# Patient Record
Sex: Female | Born: 1935 | Race: White | Hispanic: No | Smoking: Never smoker
Health system: Southern US, Community
[De-identification: ages and names within clinical notes are randomized; demographics above are authoritative.]

## PROBLEM LIST (undated history)

## (undated) DIAGNOSIS — M1990 Unspecified osteoarthritis, unspecified site: Secondary | ICD-10-CM

## (undated) DIAGNOSIS — Z8719 Personal history of other diseases of the digestive system: Secondary | ICD-10-CM

## (undated) DIAGNOSIS — K635 Polyp of colon: Secondary | ICD-10-CM

## (undated) DIAGNOSIS — T884XXA Failed or difficult intubation, initial encounter: Secondary | ICD-10-CM

## (undated) DIAGNOSIS — C4491 Basal cell carcinoma of skin, unspecified: Secondary | ICD-10-CM

## (undated) DIAGNOSIS — L97529 Non-pressure chronic ulcer of other part of left foot with unspecified severity: Secondary | ICD-10-CM

## (undated) DIAGNOSIS — K219 Gastro-esophageal reflux disease without esophagitis: Secondary | ICD-10-CM

## (undated) DIAGNOSIS — I10 Essential (primary) hypertension: Secondary | ICD-10-CM

## (undated) DIAGNOSIS — L97519 Non-pressure chronic ulcer of other part of right foot with unspecified severity: Secondary | ICD-10-CM

## (undated) HISTORY — PX: OTHER SURGICAL HISTORY: SHX169

## (undated) HISTORY — DX: Essential (primary) hypertension: I10

## (undated) HISTORY — PX: TONSILLECTOMY: SUR1361

## (undated) HISTORY — PX: DILATION AND CURETTAGE OF UTERUS: SHX78

## (undated) HISTORY — DX: Basal cell carcinoma of skin, unspecified: C44.91

## (undated) SURGERY — EGD (ESOPHAGOGASTRODUODENOSCOPY)
Anesthesia: Moderate Sedation

---

## 1997-07-20 ENCOUNTER — Other Ambulatory Visit: Admission: RE | Admit: 1997-07-20 | Discharge: 1997-07-20 | Payer: Self-pay | Admitting: Obstetrics and Gynecology

## 1998-09-03 ENCOUNTER — Other Ambulatory Visit: Admission: RE | Admit: 1998-09-03 | Discharge: 1998-09-03 | Payer: Self-pay | Admitting: Obstetrics and Gynecology

## 1999-09-19 ENCOUNTER — Other Ambulatory Visit: Admission: RE | Admit: 1999-09-19 | Discharge: 1999-09-19 | Payer: Self-pay | Admitting: Obstetrics and Gynecology

## 2000-10-03 ENCOUNTER — Other Ambulatory Visit: Admission: RE | Admit: 2000-10-03 | Discharge: 2000-10-03 | Payer: Self-pay | Admitting: Obstetrics and Gynecology

## 2001-10-24 ENCOUNTER — Other Ambulatory Visit: Admission: RE | Admit: 2001-10-24 | Discharge: 2001-10-24 | Payer: Self-pay | Admitting: Obstetrics and Gynecology

## 2002-10-27 ENCOUNTER — Other Ambulatory Visit: Admission: RE | Admit: 2002-10-27 | Discharge: 2002-10-27 | Payer: Self-pay | Admitting: Obstetrics and Gynecology

## 2003-11-04 ENCOUNTER — Other Ambulatory Visit: Admission: RE | Admit: 2003-11-04 | Discharge: 2003-11-04 | Payer: Self-pay | Admitting: Obstetrics and Gynecology

## 2004-08-09 ENCOUNTER — Encounter (INDEPENDENT_AMBULATORY_CARE_PROVIDER_SITE_OTHER): Payer: Self-pay | Admitting: *Deleted

## 2004-08-09 ENCOUNTER — Ambulatory Visit (HOSPITAL_COMMUNITY): Admission: RE | Admit: 2004-08-09 | Discharge: 2004-08-09 | Payer: Self-pay | Admitting: Gastroenterology

## 2005-11-06 ENCOUNTER — Other Ambulatory Visit: Admission: RE | Admit: 2005-11-06 | Discharge: 2005-11-06 | Payer: Self-pay | Admitting: Obstetrics and Gynecology

## 2008-01-06 ENCOUNTER — Encounter: Payer: Self-pay | Admitting: Obstetrics and Gynecology

## 2008-01-06 ENCOUNTER — Other Ambulatory Visit: Admission: RE | Admit: 2008-01-06 | Discharge: 2008-01-06 | Payer: Self-pay | Admitting: Obstetrics and Gynecology

## 2008-01-06 ENCOUNTER — Ambulatory Visit: Payer: Self-pay | Admitting: Obstetrics and Gynecology

## 2008-01-15 ENCOUNTER — Encounter: Admission: RE | Admit: 2008-01-15 | Discharge: 2008-01-15 | Payer: Self-pay | Admitting: Gastroenterology

## 2008-02-05 ENCOUNTER — Ambulatory Visit (HOSPITAL_COMMUNITY): Admission: RE | Admit: 2008-02-05 | Discharge: 2008-02-05 | Payer: Self-pay | Admitting: Gastroenterology

## 2010-06-14 NOTE — Op Note (Signed)
[name], MURTHA NO.:  [id number]   MEDICAL RECORD NO.:  [id number]          PATIENT TYPE:  AMB   LOCATION:  ENDO                         FACILITY:  [facility]   PHYSICIAN:  Danise Edge, M.D.   DATE OF BIRTH:  Mar 09, 1935   DATE OF PROCEDURE:  02/05/2008  DATE OF DISCHARGE:                               OPERATIVE REPORT   REFERRING PHYSICIAN:  Thora Lance, MD   PROCEDURES:  Esophagogastroduodenoscopy and Savary esophageal dilation.   PROCEDURE INDICATIONS:  Cassandra Myers is a 75 year old female born on  19-May-1935.  Cassandra Myers has chronic gastroesophageal reflux  complicated by a Schatzki ring at the esophagogastric junction.   In 1975, her esophagogastroduodenoscopy was normal.  In 1987, she  underwent an esophagogastroduodenoscopy with dilation of a Schatzki  ring.  When she re-developed esophageal dysphagia, she underwent a  barium esophagram with tablet on January 15, 2008, which revealed a  Schatzki ring, which reduced the caliber of the distal thoracic  esophagus by two-thirds.  A moderate hiatal hernia was also noted.  The  patient's Carafate was discontinued and I gave her samples of Aciphex to  better control her gastroesophageal reflux.  She is scheduled to undergo  an esophagogastroduodenoscopy and Savary esophageal dilation of her  symptomatic Schatzki ring today.   MEDICATIONS ALLERGIES:  MERTHIOLATE causes rash.   CHRONIC MEDICATIONS:  Sucralfate, lisinopril, multivitamin.   PAST MEDICAL AND SURGICAL HISTORY:  1. Hyperlipidemia.  2. Peptic ulcer disease in 1959 and 1975.  3. Upper gastrointestinal bleed in the 1970s requiring blood      transfusion.  4. Macular degeneration.  5. Chronic gastroesophageal reflux.  6. Tonsillectomy.  7. D and C for vaginal bleeding in 1986.  8. Jaw surgery in 1990.   HEALTH MAINTENANCE:  Pneumovax in May 2005.  Screening colonoscopy with  removal of an adenomatous colon polyp in July 2006.   FAMILY HISTORY:  Father died in 1 of lung cancer.  He also had  prostate cancer and chronic obstructive pulmonary disease.  Her 90-year-  old mother has a pacemaker and esophagus small stroke.  Her brother is  healthy.   HABITS:  The patient does not smoke cigarettes and rarely consumes  alcohol.   ENDOSCOPIST:  Danise Edge, MD   PREMEDICATIONS:  1. Fentanyl 75 mcg.  2. Versed 10 mg.   PROCEDURE:  After obtaining informed consent, Cassandra Myers was placed in the  left lateral decubitus position on the fluoroscopy table.  I  administered intravenous fentanyl and intravenous Versed to achieve  conscious sedation for the procedure.  She received 75 mcg fentanyl and  10 mg Versed.  The patient's blood pressure, oxygen saturation, and  cardiac rhythm were monitored throughout the procedure and documented in  the medical record.   The Pentax gastroscope was passed through the posterior hypopharynx into  the proximal esophagus without difficulty.  The hypopharynx, larynx, and  vocal cords appeared normal.   ESOPHAGOSCOPY:  The proximal mid and lower segments of the esophageal  mucosa appeared normal except for the presence of  a Schatzki ring at the  esophagogastric junction, which was noted at approximately 35 cm from  the incisor teeth.  There is no endoscopic evidence for the presence of  erosive esophagitis or Barrett esophagus.   GASTROSCOPY:  Cassandra Myers has a moderate-sized hiatal hernia.  Retroflex  view of the gastric cardia and fundus was otherwise normal.  The gastric  body, antrum, and pylorus appeared normal.   DUODENOSCOPY:  The duodenal bulb and descending duodenum appeared  normal.   SAVARY ESOPHAGEAL DILATION:  The Savary dilator wire was passed through  the Pentax gastroscope and the tip of the guidewire advanced to the  distal gastric antrum as confirmed endoscopically and fluoroscopically.  Under fluoroscopic guidance, the 12.8 mm, 14 mm, and 15-mm Savary   dilators passed without significant resistance.  Repeat  esophagogastroscopy confirmed satisfactory dilation of the Schatzki ring  at the esophagogastric junction and no gastric trauma due to the  guidewire.  There was a residual stricture despite passing the 15-mm  Savary dilator and she may require repeat esophageal dilation in the  future.   ASSESSMENT:  Chronic gastroesophageal reflux disease associated with a  moderate-sized hiatal hernia and complicated by a Schatzki ring at the  esophagogastric junction (35 cm from the incisor teeth), dilated with  the 12.8 mm, 14 mm, and 15-mm Savary dilators.  No endoscopic evidence  for the presence of erosive esophagitis or Barrett esophagus.   RECOMMENDATIONS:  Ms. Cassandra Myers should be switched from Carafate to a proton  pump inhibitor to better control her chronic gastroesophageal reflux.           ______________________________  Danise Edge, M.D.     MJ/MEDQ  D:  02/05/2008  T:  02/05/2008  Job:  [id number]   cc:   Thora Lance, M.D.

## 2010-06-17 NOTE — Op Note (Signed)
NAMEJOYE, WESENBERG NO.:  [id number]   MEDICAL RECORD NO.:  [id number]          PATIENT TYPE:  AMB   LOCATION:  ENDO                         FACILITY[facility]   PHYSICIAN:  Danise Edge, M.D.   DATE OF BIRTH:  October 14, 1935   DATE OF PROCEDURE:  08/09/2004  DATE OF DISCHARGE:                                 OPERATIVE REPORT   PROCEDURE:  Colonoscopy and polypectomy.   REFERRING PHYSICIAN:  Dr. Kirby Funk.   INDICATIONS:  Ms. Cassandra Myers is a 75 year old female born Jul 15, 1935.  Ms. Cassandra Myers is scheduled to undergo her first screening colonoscopy with  polypectomy to prevent colon cancer.   ENDOSCOPIST:  Danise Edge, M.D.   PREMEDICATION:  Versed 9 mg, Demerol 80 mg.   DESCRIPTION OF PROCEDURE:  After obtaining informed consent, Ms. Cassandra Myers was  placed in the left lateral decubitus position. I administered intravenous  Demerol and intravenous Versed to achieve conscious sedation for the  procedure. The patient's blood pressure, oxygen saturation and cardiac  rhythm were monitored throughout the procedure and documented in the medical  record.   Anal inspection and digital rectal exam were normal. The Olympus adjustable  pediatric colonoscope was introduced into the rectum and easily advanced to  the cecum. A normal appearing ileocecal valve and appendiceal orifice were  identified. Colonic preparation for the exam today was excellent.   RECTUM:  Normal. Retroflexed view of the distal rectum normal.  SIGMOID COLON AND DESCENDING COLON:  Normal.  SPLENIC FLEXURE:  Normal.  TRANSVERSE COLON:  Normal.  HEPATIC FLEXURE:  Normal.  ASCENDING COLON:  From the proximal ascending colon, a 2 mm sessile polyp  was removed with the electrocautery snare.  CECUM AND ILEOCECAL VALVE:  Normal.   ASSESSMENT:  A small polyp was removed from the proximal ascending colon  with the electrocautery snare; otherwise normal screening proctocolonoscopy  to the  cecum.       MJ/MEDQ  D:  08/09/2004  T:  08/09/2004  Job:  [id number]   cc:   Thora Lance, M.D.  [address]  [place]  [place] [address]  Fax: [phone]

## 2011-01-25 ENCOUNTER — Other Ambulatory Visit: Payer: Self-pay | Admitting: Dermatology

## 2011-03-27 DIAGNOSIS — C44319 Basal cell carcinoma of skin of other parts of face: Secondary | ICD-10-CM | POA: Diagnosis not present

## 2011-07-27 DIAGNOSIS — H04129 Dry eye syndrome of unspecified lacrimal gland: Secondary | ICD-10-CM | POA: Diagnosis not present

## 2011-07-27 DIAGNOSIS — H25099 Other age-related incipient cataract, unspecified eye: Secondary | ICD-10-CM | POA: Diagnosis not present

## 2011-09-25 DIAGNOSIS — Z0000 Encounter for general adult medical examination without abnormal findings: Secondary | ICD-10-CM | POA: Diagnosis not present

## 2011-09-25 DIAGNOSIS — Z1331 Encounter for screening for depression: Secondary | ICD-10-CM | POA: Diagnosis not present

## 2011-09-25 DIAGNOSIS — K219 Gastro-esophageal reflux disease without esophagitis: Secondary | ICD-10-CM | POA: Diagnosis not present

## 2011-09-25 DIAGNOSIS — I10 Essential (primary) hypertension: Secondary | ICD-10-CM | POA: Diagnosis not present

## 2011-10-12 ENCOUNTER — Other Ambulatory Visit: Payer: Self-pay | Admitting: Surgery

## 2011-10-12 DIAGNOSIS — L98499 Non-pressure chronic ulcer of skin of other sites with unspecified severity: Secondary | ICD-10-CM | POA: Diagnosis not present

## 2011-10-12 DIAGNOSIS — D485 Neoplasm of uncertain behavior of skin: Secondary | ICD-10-CM | POA: Diagnosis not present

## 2011-12-12 DIAGNOSIS — Z1231 Encounter for screening mammogram for malignant neoplasm of breast: Secondary | ICD-10-CM | POA: Diagnosis not present

## 2011-12-13 ENCOUNTER — Encounter: Payer: Self-pay | Admitting: Obstetrics and Gynecology

## 2011-12-21 DIAGNOSIS — L905 Scar conditions and fibrosis of skin: Secondary | ICD-10-CM | POA: Diagnosis not present

## 2012-01-02 DIAGNOSIS — Z23 Encounter for immunization: Secondary | ICD-10-CM | POA: Diagnosis not present

## 2012-01-11 ENCOUNTER — Encounter: Payer: Self-pay | Admitting: Gynecology

## 2012-01-11 DIAGNOSIS — I10 Essential (primary) hypertension: Secondary | ICD-10-CM | POA: Insufficient documentation

## 2012-01-23 ENCOUNTER — Other Ambulatory Visit (HOSPITAL_COMMUNITY)
Admission: RE | Admit: 2012-01-23 | Discharge: 2012-01-23 | Disposition: A | Discharge status: Home / Self Care | Payer: Medicare Other | Source: Ambulatory Visit | Attending: Obstetrics and Gynecology | Admitting: Obstetrics and Gynecology

## 2012-01-23 ENCOUNTER — Encounter: Payer: Self-pay | Admitting: Obstetrics and Gynecology

## 2012-01-23 ENCOUNTER — Ambulatory Visit (INDEPENDENT_AMBULATORY_CARE_PROVIDER_SITE_OTHER): Payer: Medicare Other | Admitting: Obstetrics and Gynecology

## 2012-01-23 VITALS — BP 120/64 | Ht 64.0 in | Wt 146.0 lb

## 2012-01-23 DIAGNOSIS — R351 Nocturia: Secondary | ICD-10-CM

## 2012-01-23 DIAGNOSIS — R3915 Urgency of urination: Secondary | ICD-10-CM | POA: Diagnosis not present

## 2012-01-23 DIAGNOSIS — N952 Postmenopausal atrophic vaginitis: Secondary | ICD-10-CM | POA: Diagnosis not present

## 2012-01-23 DIAGNOSIS — Z124 Encounter for screening for malignant neoplasm of cervix: Secondary | ICD-10-CM | POA: Diagnosis not present

## 2012-01-23 DIAGNOSIS — C4491 Basal cell carcinoma of skin, unspecified: Secondary | ICD-10-CM | POA: Insufficient documentation

## 2012-01-23 DIAGNOSIS — Z780 Asymptomatic menopausal state: Secondary | ICD-10-CM | POA: Diagnosis not present

## 2012-01-23 NOTE — Addendum Note (Signed)
Addended by: Dayna Barker on: 01/23/2012 11:19 AM   Modules accepted: Orders

## 2012-01-23 NOTE — Progress Notes (Signed)
Patient is a 76 year old gravida 0 who came to see [place] today for gynecological care. She has been on long-term patient of our office but has not been here in 4 years and of visit was treated as a new patient visit. She is currently having no hot flashes or vaginal dryness. Because of her husband's health she is not sexually active. She is having no vaginal bleeding. She is having no pelvic pain. She was on hormone replacement therapy for menopausal symptoms but stopped approximately 2003. She did have some menopausal symptoms having stopped but they have now resolved. She does get nocturia 1-2 times a night. If she waits too long to avoid she will have urgency of urination. She does not have dysuria. She is not having incontinence. She does her lab work through her PCP. She had a previous history of dysfunctional bleeding that required D&C. She is on medication for hypertension and GERD. She had a basal cell cancer removed. She has always had normal Pap smears. Her last Pap smear was 2009. Her last bone density was 2007 and was normal.  ROS: 12 system review done. Pertinent positives above. Only other positive is difficulty swallowing which has responded to Prilosec.  Physical examination:Kim Julian Reil present. HEENT within normal limits. Neck: Thyroid not large. No masses. Supraclavicular nodes: not enlarged. Breasts: Examined in both sitting and lying  position. No skin changes and no masses. Abdomen: Soft no guarding rebound or masses or hernia. Pelvic: External: Within normal limits. BUS: Within normal limits. Vaginal:within normal limits. Poor  estrogen effect. No evidence of cystocele rectocele or enterocele. Cervix: clean. Uterus: Normal size and shape. Adnexa: No masses. Rectovaginal exam: Confirmatory and negative. Extremities: Within normal limits.  Assessment: #1. Atrophic vaginitis #2. Menopausal symptoms now resolved #3. Nocturia and occasional urgency of urination  Plan: Continue yearly  mammograms. Bone density with next mammogram. Pap done. The new Pap smear guidelines were discussed with the patient.

## 2012-01-23 NOTE — Patient Instructions (Signed)
Continue yearly mammograms. Bone density with  next mammogram.

## 2012-02-08 ENCOUNTER — Encounter: Payer: Self-pay | Admitting: Obstetrics and Gynecology

## 2012-07-15 DIAGNOSIS — H25099 Other age-related incipient cataract, unspecified eye: Secondary | ICD-10-CM | POA: Diagnosis not present

## 2012-09-16 ENCOUNTER — Encounter (HOSPITAL_COMMUNITY): Payer: Self-pay | Admitting: *Deleted

## 2012-09-16 ENCOUNTER — Encounter (HOSPITAL_COMMUNITY)
Admission: EM | Disposition: A | Discharge status: Home / Self Care | Payer: Self-pay | Source: Home / Self Care | Attending: Emergency Medicine

## 2012-09-16 ENCOUNTER — Emergency Department (HOSPITAL_COMMUNITY)
Admission: EM | Admit: 2012-09-16 | Discharge: 2012-09-16 | Disposition: A | Discharge status: Home / Self Care | Payer: Medicare Other | Attending: Emergency Medicine | Admitting: Emergency Medicine

## 2012-09-16 DIAGNOSIS — T18108A Unspecified foreign body in esophagus causing other injury, initial encounter: Secondary | ICD-10-CM | POA: Insufficient documentation

## 2012-09-16 DIAGNOSIS — K222 Esophageal obstruction: Secondary | ICD-10-CM | POA: Insufficient documentation

## 2012-09-16 DIAGNOSIS — R1310 Dysphagia, unspecified: Secondary | ICD-10-CM | POA: Diagnosis not present

## 2012-09-16 DIAGNOSIS — T18128A Food in esophagus causing other injury, initial encounter: Secondary | ICD-10-CM

## 2012-09-16 DIAGNOSIS — IMO0002 Reserved for concepts with insufficient information to code with codable children: Secondary | ICD-10-CM | POA: Insufficient documentation

## 2012-09-16 HISTORY — PX: ESOPHAGOGASTRODUODENOSCOPY: SHX5428

## 2012-09-16 SURGERY — EGD (ESOPHAGOGASTRODUODENOSCOPY)
Anesthesia: Moderate Sedation

## 2012-09-16 MED ORDER — GLUCAGON HCL (RDNA) 1 MG IJ SOLR
1.0000 mg | Freq: Once | INTRAMUSCULAR | Status: AC
  Administered 2012-09-16: 1 mg via INTRAVENOUS

## 2012-09-16 MED ORDER — GLUCAGON HCL (RDNA) 1 MG IJ SOLR
1.0000 mg | Freq: Once | INTRAMUSCULAR | Status: AC
  Administered 2012-09-16: 1 mg via INTRAVENOUS
  Filled 2012-09-16 (×2): qty 1

## 2012-09-16 MED ORDER — SODIUM CHLORIDE 0.9 % IV BOLUS (SEPSIS)
500.0000 mL | Freq: Once | INTRAVENOUS | Status: AC
  Administered 2012-09-16: 500 mL via INTRAVENOUS

## 2012-09-16 MED ORDER — BUTAMBEN-TETRACAINE-BENZOCAINE 2-2-14 % EX AERO
INHALATION_SPRAY | CUTANEOUS | Status: DC | PRN
  Administered 2012-09-16: 2 via TOPICAL

## 2012-09-16 MED ORDER — FENTANYL CITRATE 0.05 MG/ML IJ SOLN
INTRAMUSCULAR | Status: DC | PRN
  Administered 2012-09-16 (×2): 25 ug via INTRAVENOUS

## 2012-09-16 MED ORDER — MIDAZOLAM HCL 5 MG/ML IJ SOLN
INTRAMUSCULAR | Status: AC
  Filled 2012-09-16: qty 2

## 2012-09-16 MED ORDER — DIPHENHYDRAMINE HCL 50 MG/ML IJ SOLN
INTRAMUSCULAR | Status: AC
  Filled 2012-09-16: qty 1

## 2012-09-16 MED ORDER — FENTANYL CITRATE 0.05 MG/ML IJ SOLN
INTRAMUSCULAR | Status: AC
  Filled 2012-09-16: qty 2

## 2012-09-16 MED ORDER — MIDAZOLAM HCL 10 MG/2ML IJ SOLN
INTRAMUSCULAR | Status: DC | PRN
  Administered 2012-09-16 (×2): 2 mg via INTRAVENOUS

## 2012-09-16 NOTE — Interval H&P Note (Signed)
History and Physical Interval Note:  09/16/2012 3:29 PM  Cassandra Myers  has presented today for surgery, with the diagnosis of foriegn body removal  The various methods of treatment have been discussed with the patient and family. After consideration of risks, benefits and other options for treatment, the patient has consented to  Procedure(s) with comments: ESOPHAGOGASTRODUODENOSCOPY (EGD) (N/A) - pat as a surgical intervention .  The patient's history has been reviewed, patient examined, no change in status, stable for surgery.  I have reviewed the patient's chart and labs.  Questions were answered to the patient's satisfaction.  Risk of aspiration, perforation and falilure to to remove discussed with pt.   Lynze Reddy JR,Edelmira Gallogly L

## 2012-09-16 NOTE — ED Notes (Signed)
Pt states ate dry piece of chicken yesterday and since has been having problems swallowing.  Now nothing will go down including her saliva. Airway intact

## 2012-09-16 NOTE — H&P (View-Only) (Signed)
[facility] Reason for consult: foreign body Referring Physician: ER. PCP: Dr. Kirby Funk G.I.: Dr. Reece Agar  Cassandra Myers is an 77 y.o. female.  HPI:  patient has a history of esophageal reflux as well as Schatski's ring. 2010 she was dilated to 15 mm by Dr. Laural Benes. She's done quite well since that time and her omeprazole is controlled reflux symptoms. Yesterday she was eating chicken and has been unable to swallow since that time she has received glucagon in the emergency room without any benefit.  Past Medical History  Diagnosis Date  . Hypertension   . Basal cell cancer     Nose    Past Surgical History  Procedure Laterality Date  . Dilation and curettage of uterus    . Jaw tumor    . Tonsillectomy    . Skin cancer excised      Family History  Problem Relation Age of Onset  . Hypertension Mother   . Stroke Mother   . Heart disease Mother   . Kidney failure Mother   . Hypertension Father   . Lung cancer Father     Social History:  reports that she has never smoked. She does not have any smokeless tobacco history on file. She reports that she does not drink alcohol or use illicit drugs.  Allergies: No Known Allergies  Medications;  PRN Meds    No results found for this or any previous visit (from the past 48 hour(s)).  No results found.             Blood pressure 156/85, pulse 87, temperature 98.2 F (36.8 C), temperature source Oral, resp. rate 28, height 5\' 4"  (1.626 m), weight 65.772 kg (145 lb), SpO2 100.00%.  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 Patient is not in anticoagulants and has no drug allergies.   Cassandra Myers JR,Cassandra Myers L 09/16/2012, 3:26 PM

## 2012-09-16 NOTE — ED Notes (Signed)
[facility].

## 2012-09-16 NOTE — ED Provider Notes (Signed)
CSN: [id number]     Arrival date & time 09/16/12  7829 History     First MD Initiated Contact with Patient 09/16/12 1117     Chief Complaint  Patient presents with  . Food lodged in throat    (Consider location/radiation/quality/duration/timing/severity/associated sxs/prior Treatment) The history is provided by the patient.   patient presents with likely esophageal foreign body. She states that around 1:00 yesterday afternoon she was eating fried chicken and felt it get stuck. She has been unable to swallow her spit since.she is previously had to have dilatations of her esophagus. She sees Dr. Danise Edge. She saw her primary care Dr. today who sent her in for further evaluation. No chest pain. She states she's vomited up the food that was above the blockage. She states initially that pain felt a little on her upper chest but now feels lower. No fevers. No difficulty breathing. She is not on anticoagulation.   Past Medical History  Diagnosis Date  . Hypertension   . Basal cell cancer     Nose   Past Surgical History  Procedure Laterality Date  . Dilation and curettage of uterus    . Jaw tumor    . Tonsillectomy    . Skin cancer excised     Family History  Problem Relation Age of Onset  . Hypertension Mother   . Stroke Mother   . Heart disease Mother   . Kidney failure Mother   . Hypertension Father   . Lung cancer Father    History  Substance Use Topics  . Smoking status: Never Smoker   . Smokeless tobacco: Not on file  . Alcohol Use: No   OB History   Grav Para Term Preterm Abortions TAB SAB Ect Mult Living   0              Review of Systems  Constitutional: Negative for activity change and appetite change.  HENT: Negative for neck stiffness.   Eyes: Negative for pain.  Respiratory: Negative for chest tightness and shortness of breath.   Cardiovascular: Negative for chest pain and leg swelling.  Gastrointestinal: Positive for vomiting. Negative for nausea,  abdominal pain and diarrhea.  Genitourinary: Negative for flank pain.  Musculoskeletal: Negative for back pain.  Skin: Negative for rash.  Neurological: Negative for weakness, numbness and headaches.  Psychiatric/Behavioral: Negative for behavioral problems.    Allergies  Review of patient's allergies indicates no known allergies.  Home Medications   No current outpatient prescriptions on file. BP 145/79  Pulse 98  Temp(Src) 98.2 F (36.8 C) (Oral)  Resp 23  Ht 5\' 4"  (1.626 m)  Wt 145 lb (65.772 kg)  BMI 24.88 kg/m2  SpO2 96% Physical Exam  Nursing note and vitals reviewed. Constitutional: She is oriented to person, place, and time. She appears well-developed and well-nourished.  HENT:  Head: Normocephalic and atraumatic.  Eyes: EOM are normal. Pupils are equal, round, and reactive to light.  Neck: Normal range of motion. Neck supple.  Cardiovascular: Normal rate, regular rhythm and normal heart sounds.   No murmur heard. Pulmonary/Chest: Effort normal and breath sounds normal. No respiratory distress. She has no wheezes. She has no rales.  Abdominal: Soft. Bowel sounds are normal. She exhibits no distension. There is no tenderness. There is no rebound and no guarding.  Musculoskeletal: Normal range of motion.  Neurological: She is alert and oriented to person, place, and time. No cranial nerve deficit.  Skin: Skin is warm and dry.  Psychiatric: She has a normal mood and affect. Her speech is normal.    ED Course   Procedures (including critical care time)  Labs Reviewed - No data to display No results found. 1. Food impaction of esophagus, initial encounter     MDM  Patient with esophageal foreign body. Food impaction of chicken. No relief with glucagon. Has had previous dilatations. Continue to be unable to handle her secretions. Was taken to endoscopy by [facility].  Juliet Rude. Rubin Payor, MD 09/16/12 [phone]

## 2012-09-16 NOTE — Op Note (Signed)
[facility] [address] [place], [address]   ENDOSCOPY PROCEDURE REPORT  PATIENT: Cassandra, Myers  MR#: [id number] BIRTHDATE: Aug 15, 1935 , 76  yrs. old GENDER: Female ENDOSCOPIST:Lallie Strahm Randa Evens, MD REFERRED BY:  ER PROCEDURE DATE:  09/16/2012 PROCEDURE:   EGD With the Removal of Food Impaction ASA CLASS:     class 2 INDICATIONS:   patient with history of esophageal stricture. His been unable to swallow since eating chicken yesterday. MEDICATION:  fentanyl 50 mcg, versed 4 mg IV TOPICAL ANESTHETIC:   cetacaine spray  DESCRIPTION OF PROCEDURE: The procedure had been explained to the patient including the possibility of aspiration, perforation, failure to remove foreign body and consent obtained. In the left lateral decubitus position the Pentax upper scope was passed into the esophagus with swallowing. There was a fair amount of liquid material in the esophagus and this was suctioned out. The distal esophagus was reached and there was a large food impaction obstructing the distal esophagus. The [facility] scientific Lucina Mellow net was placed around the food bolus and tighten down. This did seem to go through the bolus in a large piece estimated at 3 to 4 cm was removed. This was removed while the patient held her breath. The scope is and reinserted and there were several more pieces of chicken and the distal esophagus that these were quite small and easily able to be pushed into the stomach with the tip of the scope. There was a esophageal stricture/Schatski's ring it was quite edematous and swollen. As of easily passed into the stomach and there was a fair number of chicken particles in the stomach. Complete endoscopy performed. The duodenum including the 2nd portion and duodenal bulb was normal. The antrum and body of the stomach grossly normal. There was some chicken material in the proximal stomach. Patient tolerated procedure well and there were no immediate  complications.     COMPLICATIONS: None  ENDOSCOPIC IMPRESSION: 1. Food Impaction Distal Esophagus. This was removed with Lucina Mellow net. 2. Esophageal stricture/Schatski's ring  RECOMMENDATIONS: we'll go ahead and continue patient on omeprazole. We'll have her stale clear liquids today. She will follow-up in the future with Dr. Laural Benes to see about dilatation.    _______________________________ Rosalie DoctorCarman Ching, MD 09/16/2012 3:58 PM  CC: Dr. Kirby Funk.   PATIENT NAME:  Cassandra, Myers MR#: [id number]

## 2012-09-16 NOTE — Consult Note (Signed)
[facility] Reason for consult: foreign body Referring Physician: ER. PCP: Dr. John Griffin G.I.: Dr. Marty Johnson  Cassandra Myers is an 76 y.o. female.  HPI:  patient has a history of esophageal reflux as well as Schatski's ring. 2010 she was dilated to 15 mm by Dr. Johnson. She's done quite well since that time and her omeprazole is controlled reflux symptoms. Yesterday she was eating chicken and has been unable to swallow since that time she has received glucagon in the emergency room without any benefit.  Past Medical History  Diagnosis Date  . Hypertension   . Basal cell cancer     Nose    Past Surgical History  Procedure Laterality Date  . Dilation and curettage of uterus    . Jaw tumor    . Tonsillectomy    . Skin cancer excised      Family History  Problem Relation Age of Onset  . Hypertension Mother   . Stroke Mother   . Heart disease Mother   . Kidney failure Mother   . Hypertension Father   . Lung cancer Father     Social History:  reports that she has never smoked. She does not have any smokeless tobacco history on file. She reports that she does not drink alcohol or use illicit drugs.  Allergies: No Known Allergies  Medications;  PRN Meds    No results found for this or any previous visit (from the past 48 hour(s)).  No results found.             Blood pressure 156/85, pulse 87, temperature 98.2 F (36.8 C), temperature source Oral, resp. rate 28, height 5' 4" (1.626 m), weight 65.772 kg (145 lb), SpO2 100.00%.  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 Patient is not in anticoagulants and has no drug allergies.   Jadeyn Hargett JR,Rael Tilly L 09/16/2012, 3:26 PM      

## 2012-09-17 ENCOUNTER — Encounter (HOSPITAL_COMMUNITY): Payer: Self-pay | Admitting: Gastroenterology

## 2012-09-25 ENCOUNTER — Other Ambulatory Visit: Payer: Self-pay | Admitting: Gastroenterology

## 2012-09-25 DIAGNOSIS — K219 Gastro-esophageal reflux disease without esophagitis: Secondary | ICD-10-CM | POA: Diagnosis not present

## 2012-09-25 DIAGNOSIS — E785 Hyperlipidemia, unspecified: Secondary | ICD-10-CM | POA: Diagnosis not present

## 2012-09-25 DIAGNOSIS — I10 Essential (primary) hypertension: Secondary | ICD-10-CM | POA: Diagnosis not present

## 2012-09-27 DIAGNOSIS — Z23 Encounter for immunization: Secondary | ICD-10-CM | POA: Diagnosis not present

## 2012-10-02 ENCOUNTER — Encounter (HOSPITAL_COMMUNITY)
Admission: RE | Disposition: A | Discharge status: Home / Self Care | Payer: Self-pay | Source: Ambulatory Visit | Attending: Gastroenterology

## 2012-10-02 ENCOUNTER — Ambulatory Visit (HOSPITAL_COMMUNITY)
Admission: RE | Admit: 2012-10-02 | Discharge: 2012-10-02 | Disposition: A | Discharge status: Home / Self Care | Payer: Medicare Other | Source: Ambulatory Visit | Attending: Gastroenterology | Admitting: Gastroenterology

## 2012-10-02 ENCOUNTER — Encounter (HOSPITAL_COMMUNITY): Payer: Self-pay | Admitting: *Deleted

## 2012-10-02 DIAGNOSIS — K222 Esophageal obstruction: Secondary | ICD-10-CM | POA: Insufficient documentation

## 2012-10-02 DIAGNOSIS — E7800 Pure hypercholesterolemia, unspecified: Secondary | ICD-10-CM | POA: Insufficient documentation

## 2012-10-02 DIAGNOSIS — K219 Gastro-esophageal reflux disease without esophagitis: Secondary | ICD-10-CM | POA: Diagnosis not present

## 2012-10-02 DIAGNOSIS — D131 Benign neoplasm of stomach: Secondary | ICD-10-CM | POA: Diagnosis not present

## 2012-10-02 DIAGNOSIS — I10 Essential (primary) hypertension: Secondary | ICD-10-CM | POA: Insufficient documentation

## 2012-10-02 DIAGNOSIS — H3530 Unspecified macular degeneration: Secondary | ICD-10-CM | POA: Insufficient documentation

## 2012-10-02 DIAGNOSIS — K449 Diaphragmatic hernia without obstruction or gangrene: Secondary | ICD-10-CM | POA: Insufficient documentation

## 2012-10-02 HISTORY — DX: Unspecified osteoarthritis, unspecified site: M19.90

## 2012-10-02 HISTORY — PX: BALLOON DILATION: SHX5330

## 2012-10-02 HISTORY — PX: ESOPHAGOGASTRODUODENOSCOPY: SHX5428

## 2012-10-02 HISTORY — DX: Non-pressure chronic ulcer of other part of right foot with unspecified severity: L97.519

## 2012-10-02 HISTORY — DX: Gastro-esophageal reflux disease without esophagitis: K21.9

## 2012-10-02 HISTORY — DX: Failed or difficult intubation, initial encounter: T88.4XXA

## 2012-10-02 HISTORY — DX: Personal history of other diseases of the digestive system: Z87.19

## 2012-10-02 HISTORY — DX: Non-pressure chronic ulcer of other part of left foot with unspecified severity: L97.529

## 2012-10-02 HISTORY — DX: Polyp of colon: K63.5

## 2012-10-02 SURGERY — EGD (ESOPHAGOGASTRODUODENOSCOPY)
Anesthesia: Moderate Sedation

## 2012-10-02 MED ORDER — FENTANYL CITRATE 0.05 MG/ML IJ SOLN
INTRAMUSCULAR | Status: AC
  Filled 2012-10-02: qty 2

## 2012-10-02 MED ORDER — MIDAZOLAM HCL 10 MG/2ML IJ SOLN
INTRAMUSCULAR | Status: DC | PRN
  Administered 2012-10-02 (×2): 2 mg via INTRAVENOUS
  Administered 2012-10-02: 1 mg via INTRAVENOUS

## 2012-10-02 MED ORDER — FENTANYL CITRATE 0.05 MG/ML IJ SOLN
INTRAMUSCULAR | Status: DC | PRN
  Administered 2012-10-02 (×2): 25 ug via INTRAVENOUS

## 2012-10-02 MED ORDER — BUTAMBEN-TETRACAINE-BENZOCAINE 2-2-14 % EX AERO
INHALATION_SPRAY | CUTANEOUS | Status: DC | PRN
  Administered 2012-10-02: 2 via TOPICAL

## 2012-10-02 MED ORDER — SODIUM CHLORIDE 0.9 % IV SOLN
INTRAVENOUS | Status: DC
  Administered 2012-10-02: 500 mL via INTRAVENOUS

## 2012-10-02 MED ORDER — MIDAZOLAM HCL 10 MG/2ML IJ SOLN
INTRAMUSCULAR | Status: AC
  Filled 2012-10-02: qty 2

## 2012-10-02 NOTE — Op Note (Signed)
Problem: Esophageal dysphagia secondary to a benign stricture at the esophagogastric junction  Endoscopist: Danise Edge  Premedication: Fentanyl 50 mcg. Versed 5 mg.  Procedure: Diagnostic esophagogastroduodenoscopy with balloon dilation of the benign stricture at the esophagogastric junction The patient was placed in the left lateral decubitus position. The Pentax gastroscope was passed through the posterior hypopharynx into the proximal esophagus without difficulty.  Esophagoscopy: The proximal and mid segments of the esophageal mucosa appear normal. There is a benign-appearing stricture at the esophagogastric junction noted at approximately 31 cm from the incisor teeth. There is no endoscopic evidence for the presence of erosive esophagitis or Barrett's esophagus. Using the esophageal balloon dilator, the benign stricture at the esophagogastric junction was dilated to 18 mm without apparent complications.  Gastroscopy: There is a large hiatal hernia. Retroflexed view of the gastric cardia and fundus is normal. There are a few scattered 3 mm fundic gland-appearing polyps in the gastric body. Otherwise the gastric body, antrum, and pylorus appear normal.  Duodenoscopy: The duodenal bulb and descending duodenum appear normal.  Assessment: Chronic gastroesophageal reflux complicated by a benign stricture at the esophagogastric junction associated with a large hiatal hernia. The benign stricture at the esophagogastric junction was dilated to 18 mm using the esophageal balloon dilator.

## 2012-10-02 NOTE — H&P (Signed)
  Problem: Esophageal dysphagia secondary to a benign stricture at the esophagogastric junction  History: The patient is a 77 year old female born [birth year] who has chronic gastroesophageal reflux complicated by a benign stricture at the esophagogastric junction. Approximately 3 weeks ago, the patient developed a food bolus obstructing the distal esophagus which was relieved endoscopically. She is scheduled to undergo a diagnostic esophagogastroduodenoscopy with balloon dilation of the benign distal esophageal stricture today.  Past medical history: Hypertension. Chronic gastroesophageal reflux complicated by a benign stricture at the esophagogastric junction. Macular degeneration. Hypercholesterolemia. Skin cancers removed. Tonsillectomy. D&C for vaginal bleeding in 1986. Jaw surgery in 1990.  Medication allergies: None  Exam: The patient is alert and lying comfortably on the endoscopy stretcher. Abdomen is soft and nontender to palpation. Lungs are clear to auscultation. Cardiac exam reveals a regular rhythm.  Plan: Proceed with diagnostic esophagogastroduodenoscopy with balloon dilation of a benign stricture at the esophagogastric junction

## 2012-10-03 ENCOUNTER — Encounter (HOSPITAL_COMMUNITY): Payer: Self-pay | Admitting: Gastroenterology

## 2012-11-11 DIAGNOSIS — E782 Mixed hyperlipidemia: Secondary | ICD-10-CM | POA: Diagnosis not present

## 2012-11-11 DIAGNOSIS — Z79899 Other long term (current) drug therapy: Secondary | ICD-10-CM | POA: Diagnosis not present

## 2012-12-23 DIAGNOSIS — Z1231 Encounter for screening mammogram for malignant neoplasm of breast: Secondary | ICD-10-CM | POA: Diagnosis not present

## 2013-07-08 DIAGNOSIS — L820 Inflamed seborrheic keratosis: Secondary | ICD-10-CM | POA: Diagnosis not present

## 2013-07-08 DIAGNOSIS — D485 Neoplasm of uncertain behavior of skin: Secondary | ICD-10-CM | POA: Diagnosis not present

## 2013-07-08 DIAGNOSIS — L570 Actinic keratosis: Secondary | ICD-10-CM | POA: Diagnosis not present

## 2013-08-11 DIAGNOSIS — H04129 Dry eye syndrome of unspecified lacrimal gland: Secondary | ICD-10-CM | POA: Diagnosis not present

## 2013-08-11 DIAGNOSIS — H25099 Other age-related incipient cataract, unspecified eye: Secondary | ICD-10-CM | POA: Diagnosis not present

## 2013-08-11 DIAGNOSIS — H353190 Nonexudative age-related macular degeneration, unspecified eye, stage unspecified: Secondary | ICD-10-CM | POA: Diagnosis not present

## 2013-09-26 DIAGNOSIS — I10 Essential (primary) hypertension: Secondary | ICD-10-CM | POA: Diagnosis not present

## 2013-09-26 DIAGNOSIS — Z1331 Encounter for screening for depression: Secondary | ICD-10-CM | POA: Diagnosis not present

## 2013-09-26 DIAGNOSIS — Z23 Encounter for immunization: Secondary | ICD-10-CM | POA: Diagnosis not present

## 2013-09-26 DIAGNOSIS — E781 Pure hyperglyceridemia: Secondary | ICD-10-CM | POA: Diagnosis not present

## 2013-09-26 DIAGNOSIS — K219 Gastro-esophageal reflux disease without esophagitis: Secondary | ICD-10-CM | POA: Diagnosis not present

## 2013-09-26 DIAGNOSIS — N183 Chronic kidney disease, stage 3 unspecified: Secondary | ICD-10-CM | POA: Diagnosis not present

## 2013-09-26 DIAGNOSIS — Z0000 Encounter for general adult medical examination without abnormal findings: Secondary | ICD-10-CM | POA: Diagnosis not present

## 2013-09-26 DIAGNOSIS — E785 Hyperlipidemia, unspecified: Secondary | ICD-10-CM | POA: Diagnosis not present

## 2013-11-08 DIAGNOSIS — Z23 Encounter for immunization: Secondary | ICD-10-CM | POA: Diagnosis not present

## 2014-04-13 DIAGNOSIS — Z1231 Encounter for screening mammogram for malignant neoplasm of breast: Secondary | ICD-10-CM | POA: Diagnosis not present

## 2014-05-04 ENCOUNTER — Other Ambulatory Visit: Payer: Self-pay | Admitting: Physician Assistant

## 2014-05-04 DIAGNOSIS — C44619 Basal cell carcinoma of skin of left upper limb, including shoulder: Secondary | ICD-10-CM | POA: Diagnosis not present

## 2014-05-04 DIAGNOSIS — D485 Neoplasm of uncertain behavior of skin: Secondary | ICD-10-CM | POA: Diagnosis not present

## 2014-05-04 DIAGNOSIS — L570 Actinic keratosis: Secondary | ICD-10-CM | POA: Diagnosis not present

## 2014-06-11 DIAGNOSIS — Z85828 Personal history of other malignant neoplasm of skin: Secondary | ICD-10-CM | POA: Diagnosis not present

## 2014-06-11 DIAGNOSIS — L570 Actinic keratosis: Secondary | ICD-10-CM | POA: Diagnosis not present

## 2014-06-11 DIAGNOSIS — Z08 Encounter for follow-up examination after completed treatment for malignant neoplasm: Secondary | ICD-10-CM | POA: Diagnosis not present

## 2014-09-10 DIAGNOSIS — L905 Scar conditions and fibrosis of skin: Secondary | ICD-10-CM | POA: Diagnosis not present

## 2014-09-10 DIAGNOSIS — Z85828 Personal history of other malignant neoplasm of skin: Secondary | ICD-10-CM | POA: Diagnosis not present

## 2014-09-10 DIAGNOSIS — L570 Actinic keratosis: Secondary | ICD-10-CM | POA: Diagnosis not present

## 2014-09-10 DIAGNOSIS — Z08 Encounter for follow-up examination after completed treatment for malignant neoplasm: Secondary | ICD-10-CM | POA: Diagnosis not present

## 2014-09-29 DIAGNOSIS — I129 Hypertensive chronic kidney disease with stage 1 through stage 4 chronic kidney disease, or unspecified chronic kidney disease: Secondary | ICD-10-CM | POA: Diagnosis not present

## 2014-09-29 DIAGNOSIS — N183 Chronic kidney disease, stage 3 (moderate): Secondary | ICD-10-CM | POA: Diagnosis not present

## 2014-09-29 DIAGNOSIS — Z780 Asymptomatic menopausal state: Secondary | ICD-10-CM | POA: Diagnosis not present

## 2014-09-29 DIAGNOSIS — Z23 Encounter for immunization: Secondary | ICD-10-CM | POA: Diagnosis not present

## 2014-09-29 DIAGNOSIS — K219 Gastro-esophageal reflux disease without esophagitis: Secondary | ICD-10-CM | POA: Diagnosis not present

## 2014-09-29 DIAGNOSIS — E782 Mixed hyperlipidemia: Secondary | ICD-10-CM | POA: Diagnosis not present

## 2014-10-16 DIAGNOSIS — H3530 Unspecified macular degeneration: Secondary | ICD-10-CM | POA: Diagnosis not present

## 2014-10-16 DIAGNOSIS — H04129 Dry eye syndrome of unspecified lacrimal gland: Secondary | ICD-10-CM | POA: Diagnosis not present

## 2015-03-11 DIAGNOSIS — L570 Actinic keratosis: Secondary | ICD-10-CM | POA: Diagnosis not present

## 2015-03-11 DIAGNOSIS — L218 Other seborrheic dermatitis: Secondary | ICD-10-CM | POA: Diagnosis not present

## 2015-03-11 DIAGNOSIS — Z85828 Personal history of other malignant neoplasm of skin: Secondary | ICD-10-CM | POA: Diagnosis not present

## 2015-03-11 DIAGNOSIS — L249 Irritant contact dermatitis, unspecified cause: Secondary | ICD-10-CM | POA: Diagnosis not present

## 2015-03-19 DIAGNOSIS — S86312A Strain of muscle(s) and tendon(s) of peroneal muscle group at lower leg level, left leg, initial encounter: Secondary | ICD-10-CM | POA: Diagnosis not present

## 2015-04-09 DIAGNOSIS — S86312D Strain of muscle(s) and tendon(s) of peroneal muscle group at lower leg level, left leg, subsequent encounter: Secondary | ICD-10-CM | POA: Diagnosis not present

## 2015-04-26 DIAGNOSIS — S86312D Strain of muscle(s) and tendon(s) of peroneal muscle group at lower leg level, left leg, subsequent encounter: Secondary | ICD-10-CM | POA: Diagnosis not present

## 2015-04-30 DIAGNOSIS — S86312D Strain of muscle(s) and tendon(s) of peroneal muscle group at lower leg level, left leg, subsequent encounter: Secondary | ICD-10-CM | POA: Diagnosis not present

## 2015-05-04 DIAGNOSIS — S86312D Strain of muscle(s) and tendon(s) of peroneal muscle group at lower leg level, left leg, subsequent encounter: Secondary | ICD-10-CM | POA: Diagnosis not present

## 2015-05-06 DIAGNOSIS — S86312D Strain of muscle(s) and tendon(s) of peroneal muscle group at lower leg level, left leg, subsequent encounter: Secondary | ICD-10-CM | POA: Diagnosis not present

## 2015-05-11 DIAGNOSIS — S86312D Strain of muscle(s) and tendon(s) of peroneal muscle group at lower leg level, left leg, subsequent encounter: Secondary | ICD-10-CM | POA: Diagnosis not present

## 2015-05-13 DIAGNOSIS — L570 Actinic keratosis: Secondary | ICD-10-CM | POA: Diagnosis not present

## 2015-05-13 DIAGNOSIS — Z85828 Personal history of other malignant neoplasm of skin: Secondary | ICD-10-CM | POA: Diagnosis not present

## 2015-05-13 DIAGNOSIS — D225 Melanocytic nevi of trunk: Secondary | ICD-10-CM | POA: Diagnosis not present

## 2015-05-13 DIAGNOSIS — L821 Other seborrheic keratosis: Secondary | ICD-10-CM | POA: Diagnosis not present

## 2015-05-13 DIAGNOSIS — S86312D Strain of muscle(s) and tendon(s) of peroneal muscle group at lower leg level, left leg, subsequent encounter: Secondary | ICD-10-CM | POA: Diagnosis not present

## 2015-05-18 DIAGNOSIS — S86312D Strain of muscle(s) and tendon(s) of peroneal muscle group at lower leg level, left leg, subsequent encounter: Secondary | ICD-10-CM | POA: Diagnosis not present

## 2015-05-20 DIAGNOSIS — S86312D Strain of muscle(s) and tendon(s) of peroneal muscle group at lower leg level, left leg, subsequent encounter: Secondary | ICD-10-CM | POA: Diagnosis not present

## 2015-05-21 DIAGNOSIS — S86312D Strain of muscle(s) and tendon(s) of peroneal muscle group at lower leg level, left leg, subsequent encounter: Secondary | ICD-10-CM | POA: Diagnosis not present

## 2015-05-31 DIAGNOSIS — Z1231 Encounter for screening mammogram for malignant neoplasm of breast: Secondary | ICD-10-CM | POA: Diagnosis not present

## 2015-06-24 DIAGNOSIS — D485 Neoplasm of uncertain behavior of skin: Secondary | ICD-10-CM | POA: Diagnosis not present

## 2015-06-24 DIAGNOSIS — L570 Actinic keratosis: Secondary | ICD-10-CM | POA: Diagnosis not present

## 2015-06-24 DIAGNOSIS — C44212 Basal cell carcinoma of skin of right ear and external auricular canal: Secondary | ICD-10-CM | POA: Diagnosis not present

## 2015-09-09 DIAGNOSIS — C44219 Basal cell carcinoma of skin of left ear and external auricular canal: Secondary | ICD-10-CM | POA: Diagnosis not present

## 2015-09-30 DIAGNOSIS — K219 Gastro-esophageal reflux disease without esophagitis: Secondary | ICD-10-CM | POA: Diagnosis not present

## 2015-09-30 DIAGNOSIS — I10 Essential (primary) hypertension: Secondary | ICD-10-CM | POA: Diagnosis not present

## 2015-09-30 DIAGNOSIS — H9191 Unspecified hearing loss, right ear: Secondary | ICD-10-CM | POA: Diagnosis not present

## 2015-09-30 DIAGNOSIS — Z0000 Encounter for general adult medical examination without abnormal findings: Secondary | ICD-10-CM | POA: Diagnosis not present

## 2015-09-30 DIAGNOSIS — N183 Chronic kidney disease, stage 3 (moderate): Secondary | ICD-10-CM | POA: Diagnosis not present

## 2015-09-30 DIAGNOSIS — Z1389 Encounter for screening for other disorder: Secondary | ICD-10-CM | POA: Diagnosis not present

## 2015-09-30 DIAGNOSIS — E782 Mixed hyperlipidemia: Secondary | ICD-10-CM | POA: Diagnosis not present

## 2015-09-30 DIAGNOSIS — Z23 Encounter for immunization: Secondary | ICD-10-CM | POA: Diagnosis not present

## 2015-10-07 DIAGNOSIS — C44219 Basal cell carcinoma of skin of left ear and external auricular canal: Secondary | ICD-10-CM | POA: Diagnosis not present

## 2015-10-25 DIAGNOSIS — H6121 Impacted cerumen, right ear: Secondary | ICD-10-CM | POA: Diagnosis not present

## 2015-11-19 DIAGNOSIS — E119 Type 2 diabetes mellitus without complications: Secondary | ICD-10-CM | POA: Diagnosis not present

## 2015-11-19 DIAGNOSIS — H3530 Unspecified macular degeneration: Secondary | ICD-10-CM | POA: Diagnosis not present

## 2015-11-19 DIAGNOSIS — H25091 Other age-related incipient cataract, right eye: Secondary | ICD-10-CM | POA: Diagnosis not present

## 2015-11-19 DIAGNOSIS — H353131 Nonexudative age-related macular degeneration, bilateral, early dry stage: Secondary | ICD-10-CM | POA: Diagnosis not present

## 2016-02-08 DIAGNOSIS — H353130 Nonexudative age-related macular degeneration, bilateral, stage unspecified: Secondary | ICD-10-CM | POA: Diagnosis not present

## 2016-02-08 DIAGNOSIS — H2513 Age-related nuclear cataract, bilateral: Secondary | ICD-10-CM | POA: Diagnosis not present

## 2016-02-08 DIAGNOSIS — H353131 Nonexudative age-related macular degeneration, bilateral, early dry stage: Secondary | ICD-10-CM | POA: Diagnosis not present

## 2016-02-08 DIAGNOSIS — I10 Essential (primary) hypertension: Secondary | ICD-10-CM | POA: Diagnosis not present

## 2016-02-08 DIAGNOSIS — H02839 Dermatochalasis of unspecified eye, unspecified eyelid: Secondary | ICD-10-CM | POA: Diagnosis not present

## 2016-02-08 DIAGNOSIS — H2512 Age-related nuclear cataract, left eye: Secondary | ICD-10-CM | POA: Diagnosis not present

## 2016-03-03 DIAGNOSIS — H25012 Cortical age-related cataract, left eye: Secondary | ICD-10-CM | POA: Diagnosis not present

## 2016-03-03 DIAGNOSIS — H2512 Age-related nuclear cataract, left eye: Secondary | ICD-10-CM | POA: Diagnosis not present

## 2016-03-03 DIAGNOSIS — H25812 Combined forms of age-related cataract, left eye: Secondary | ICD-10-CM | POA: Diagnosis not present

## 2016-03-03 DIAGNOSIS — H25042 Posterior subcapsular polar age-related cataract, left eye: Secondary | ICD-10-CM | POA: Diagnosis not present

## 2016-07-06 DIAGNOSIS — Z1231 Encounter for screening mammogram for malignant neoplasm of breast: Secondary | ICD-10-CM | POA: Diagnosis not present

## 2016-08-15 DIAGNOSIS — H903 Sensorineural hearing loss, bilateral: Secondary | ICD-10-CM | POA: Diagnosis not present

## 2016-08-21 DIAGNOSIS — L570 Actinic keratosis: Secondary | ICD-10-CM | POA: Diagnosis not present

## 2016-08-21 DIAGNOSIS — C44319 Basal cell carcinoma of skin of other parts of face: Secondary | ICD-10-CM | POA: Diagnosis not present

## 2016-09-11 DIAGNOSIS — C44319 Basal cell carcinoma of skin of other parts of face: Secondary | ICD-10-CM | POA: Diagnosis not present

## 2016-09-27 DIAGNOSIS — I10 Essential (primary) hypertension: Secondary | ICD-10-CM | POA: Diagnosis not present

## 2016-09-27 DIAGNOSIS — E782 Mixed hyperlipidemia: Secondary | ICD-10-CM | POA: Diagnosis not present

## 2016-09-27 DIAGNOSIS — Z0000 Encounter for general adult medical examination without abnormal findings: Secondary | ICD-10-CM | POA: Diagnosis not present

## 2016-09-27 DIAGNOSIS — K219 Gastro-esophageal reflux disease without esophagitis: Secondary | ICD-10-CM | POA: Diagnosis not present

## 2016-09-27 DIAGNOSIS — Z1389 Encounter for screening for other disorder: Secondary | ICD-10-CM | POA: Diagnosis not present

## 2016-10-23 DIAGNOSIS — Z85828 Personal history of other malignant neoplasm of skin: Secondary | ICD-10-CM | POA: Diagnosis not present

## 2016-10-23 DIAGNOSIS — L905 Scar conditions and fibrosis of skin: Secondary | ICD-10-CM | POA: Diagnosis not present

## 2016-10-26 DIAGNOSIS — Z23 Encounter for immunization: Secondary | ICD-10-CM | POA: Diagnosis not present

## 2016-10-30 DIAGNOSIS — M5431 Sciatica, right side: Secondary | ICD-10-CM | POA: Diagnosis not present

## 2016-11-20 DIAGNOSIS — M5431 Sciatica, right side: Secondary | ICD-10-CM | POA: Diagnosis not present

## 2017-04-02 DIAGNOSIS — H25091 Other age-related incipient cataract, right eye: Secondary | ICD-10-CM | POA: Diagnosis not present

## 2017-04-02 DIAGNOSIS — H353131 Nonexudative age-related macular degeneration, bilateral, early dry stage: Secondary | ICD-10-CM | POA: Diagnosis not present

## 2017-04-02 DIAGNOSIS — H524 Presbyopia: Secondary | ICD-10-CM | POA: Diagnosis not present

## 2017-04-02 DIAGNOSIS — Z961 Presence of intraocular lens: Secondary | ICD-10-CM | POA: Diagnosis not present

## 2017-04-02 DIAGNOSIS — H52222 Regular astigmatism, left eye: Secondary | ICD-10-CM | POA: Diagnosis not present

## 2017-04-02 DIAGNOSIS — H5213 Myopia, bilateral: Secondary | ICD-10-CM | POA: Diagnosis not present

## 2017-04-02 DIAGNOSIS — H04129 Dry eye syndrome of unspecified lacrimal gland: Secondary | ICD-10-CM | POA: Diagnosis not present

## 2017-04-18 DIAGNOSIS — A084 Viral intestinal infection, unspecified: Secondary | ICD-10-CM | POA: Diagnosis not present

## 2017-04-20 ENCOUNTER — Encounter (HOSPITAL_BASED_OUTPATIENT_CLINIC_OR_DEPARTMENT_OTHER): Payer: Self-pay | Admitting: *Deleted

## 2017-04-20 ENCOUNTER — Emergency Department (HOSPITAL_BASED_OUTPATIENT_CLINIC_OR_DEPARTMENT_OTHER)
Admission: EM | Admit: 2017-04-20 | Discharge: 2017-04-20 | Disposition: A | Discharge status: Home / Self Care | Payer: Medicare Other | Attending: Emergency Medicine | Admitting: Emergency Medicine

## 2017-04-20 ENCOUNTER — Other Ambulatory Visit: Payer: Self-pay

## 2017-04-20 DIAGNOSIS — I10 Essential (primary) hypertension: Secondary | ICD-10-CM | POA: Insufficient documentation

## 2017-04-20 DIAGNOSIS — Z79899 Other long term (current) drug therapy: Secondary | ICD-10-CM | POA: Diagnosis not present

## 2017-04-20 DIAGNOSIS — E860 Dehydration: Secondary | ICD-10-CM | POA: Diagnosis not present

## 2017-04-20 DIAGNOSIS — R197 Diarrhea, unspecified: Secondary | ICD-10-CM | POA: Insufficient documentation

## 2017-04-20 DIAGNOSIS — Z85828 Personal history of other malignant neoplasm of skin: Secondary | ICD-10-CM | POA: Insufficient documentation

## 2017-04-20 DIAGNOSIS — A084 Viral intestinal infection, unspecified: Secondary | ICD-10-CM | POA: Diagnosis not present

## 2017-04-20 LAB — COMPREHENSIVE METABOLIC PANEL WITH GFR
ALT: 21 U/L (ref 14–54)
AST: 27 U/L (ref 15–41)
Albumin: 3.7 g/dL (ref 3.5–5.0)
Alkaline Phosphatase: 61 U/L (ref 38–126)
Anion gap: 11 (ref 5–15)
BUN: 21 mg/dL — ABNORMAL HIGH (ref 6–20)
CO2: 25 mmol/L (ref 22–32)
Calcium: 9.3 mg/dL (ref 8.9–10.3)
Chloride: 102 mmol/L (ref 101–111)
Creatinine, Ser: 0.95 mg/dL (ref 0.44–1.00)
GFR calc Af Amer: >60 mL/min
GFR calc non Af Amer: 55 mL/min — ABNORMAL LOW
Glucose, Bld: 116 mg/dL — ABNORMAL HIGH (ref 65–99)
Potassium: 3.4 mmol/L — ABNORMAL LOW (ref 3.5–5.1)
Sodium: 138 mmol/L (ref 135–145)
Total Bilirubin: 1.3 mg/dL — ABNORMAL HIGH (ref 0.3–1.2)
Total Protein: 7.5 g/dL (ref 6.5–8.1)

## 2017-04-20 LAB — CBC WITH DIFFERENTIAL/PLATELET
Basophils Absolute: 0 K/uL (ref 0.0–0.1)
Basophils Relative: 0 %
Eosinophils Absolute: 0 K/uL (ref 0.0–0.7)
Eosinophils Relative: 0 %
HCT: 44.1 % (ref 36.0–46.0)
Hemoglobin: 14.6 g/dL (ref 12.0–15.0)
Lymphocytes Relative: 12 %
Lymphs Abs: 1.5 K/uL (ref 0.7–4.0)
MCH: 29.9 pg (ref 26.0–34.0)
MCHC: 33.1 g/dL (ref 30.0–36.0)
MCV: 90.4 fL (ref 78.0–100.0)
Monocytes Absolute: 1.5 K/uL — ABNORMAL HIGH (ref 0.1–1.0)
Monocytes Relative: 12 %
Neutro Abs: 9.8 K/uL — ABNORMAL HIGH (ref 1.7–7.7)
Neutrophils Relative %: 76 %
Platelets: 245 K/uL (ref 150–400)
RBC: 4.88 MIL/uL (ref 3.87–5.11)
RDW: 13.6 % (ref 11.5–15.5)
WBC: 12.8 K/uL — ABNORMAL HIGH (ref 4.0–10.5)

## 2017-04-20 LAB — URINALYSIS, MICROSCOPIC (REFLEX)

## 2017-04-20 LAB — URINALYSIS, ROUTINE W REFLEX MICROSCOPIC
Bilirubin Urine: NEGATIVE
Glucose, UA: NEGATIVE mg/dL
Ketones, ur: 15 mg/dL — AB
Nitrite: NEGATIVE
Protein, ur: NEGATIVE mg/dL
Specific Gravity, Urine: 1.02 (ref 1.005–1.030)
pH: 5.5 (ref 5.0–8.0)

## 2017-04-20 MED ORDER — SODIUM CHLORIDE 0.9 % IV BOLUS (SEPSIS)
1000.0000 mL | Freq: Once | INTRAVENOUS | Status: AC
  Administered 2017-04-20: 1000 mL via INTRAVENOUS

## 2017-04-20 NOTE — ED Triage Notes (Signed)
Her doctor saw her this am for diarrhea x 4 days. The diarrhea stopped yesterday but she feels dehydrated.

## 2017-04-20 NOTE — ED Provider Notes (Signed)
[facility] EMERGENCY DEPARTMENT Provider Note   CSN: [id number] Arrival date & time: 04/20/17  1210     History   Chief Complaint Chief Complaint  Patient presents with  . Dehydration    HPI Cassandra Myers is a 82 y.o. female.  Pt presents to the ED today with diarrhea.  She said she's had diarrhea for 4 days.  She said the diarrhea stopped yesterday, but still feels dehydrated.  She has been able to tolerate some fluids, but not much.  She did see pcp today who sent her to the ED for fluids.     Past Medical History:  Diagnosis Date  . Arthritis    arthritis in feet  . Basal cell cancer    Nose  . Colon polyps   . Difficult intubation    2nd cousin result of anesthesia  . GERD (gastroesophageal reflux disease)   . H/O hiatal hernia   . Hypertension   . Ulcers of both great toes ([place])    duodenal [phone]    Patient Active Problem List   Diagnosis Date Noted  . Basal cell cancer   . Hypertension     Past Surgical History:  Procedure Laterality Date  . BALLOON DILATION N/A 10/02/2012   Procedure: BALLOON DILATION;  Surgeon: Garlan Fair, MD;  Location: [facility];  Service: Endoscopy;  Laterality: N/A;  . colonoscoy X2     polypectomy X2  . DILATION AND CURETTAGE OF UTERUS    . ESOPHAGOGASTRODUODENOSCOPY N/A 09/16/2012   Procedure: ESOPHAGOGASTRODUODENOSCOPY (EGD);  Surgeon: Winfield Cunas., MD;  Location: [facility] ENDOSCOPY;  Service: Endoscopy;  Laterality: N/A;  pat  . ESOPHAGOGASTRODUODENOSCOPY N/A 10/02/2012   Procedure: ESOPHAGOGASTRODUODENOSCOPY (EGD);  Surgeon: Garlan Fair, MD;  Location: [facility];  Service: Endoscopy;  Laterality: N/A;  No CARM per office  . Jaw tumor    . skin cancer excised    . TONSILLECTOMY      OB History    Gravida  0   Para      Term      Preterm      AB      Living        SAB      TAB      Ectopic      Multiple      Live Births               Home Medications    Prior to  Admission medications   Medication Sig Start Date End Date Taking? Authorizing Provider  atorvastatin (LIPITOR) 10 MG tablet Take 10 mg by mouth daily. 09/27/12   [provider]  beta carotene w/minerals (OCUVITE) tablet Take 1 tablet by mouth daily.    [provider]  Cholecalciferol (VITAMIN D-3) 1000 UNITS CAPS Take 1,000 Units by mouth daily.    [provider]  lisinopril (PRINIVIL,ZESTRIL) 10 MG tablet Take 10 mg by mouth daily.    [provider]  Multiple Vitamin (MULTIVITAMIN) tablet Take 1 tablet by mouth daily.    [provider]  omeprazole (PRILOSEC) 20 MG capsule Take 20 mg by mouth daily.    [provider]    Family History Family History  Problem Relation Age of Onset  . Hypertension Mother   . Stroke Mother   . Heart disease Mother   . Kidney failure Mother   . Hypertension Father   . Lung cancer Father     Social History Social  History   Tobacco Use  . Smoking status: Never Smoker  . Smokeless tobacco: Never Used  Substance Use Topics  . Alcohol use: No  . Drug use: No     Allergies   Patient has no known allergies.   Review of Systems Review of Systems  Gastrointestinal: Positive for diarrhea.  All other systems reviewed and are negative.    Physical Exam Updated Vital Signs BP (!) 152/86   Pulse 87   Temp 98.2 F (36.8 C) (Oral)   Resp 18   Ht 5' 4.5" (1.638 m)   Wt 63 kg (139 lb)   SpO2 98%   BMI 23.49 kg/m   Physical Exam  Constitutional: She is oriented to person, place, and time. She appears well-developed and well-nourished.  HENT:  Head: Normocephalic and atraumatic.  Right Ear: External ear normal.  Left Ear: External ear normal.  Nose: Nose normal.  Mouth/Throat: Mucous membranes are dry.  Eyes: Pupils are equal, round, and reactive to light. Conjunctivae and EOM are normal.  Neck: Normal range of motion. Neck supple.  Cardiovascular: Normal rate, regular rhythm,  normal heart sounds and intact distal pulses.  Pulmonary/Chest: Effort normal and breath sounds normal.  Abdominal: Soft. Bowel sounds are normal.  Musculoskeletal: Normal range of motion.  Neurological: She is alert and oriented to person, place, and time.  Skin: Skin is warm. Capillary refill takes less than 2 seconds.  Psychiatric: She has a normal mood and affect. Her behavior is normal. Judgment and thought content normal.  Nursing note and vitals reviewed.    ED Treatments / Results  Labs (all labs ordered are listed, but only abnormal results are displayed) Labs Reviewed  CBC WITH DIFFERENTIAL/PLATELET - Abnormal; Notable for the following components:      Result Value   WBC 12.8 (*)    Neutro Abs 9.8 (*)    Monocytes Absolute 1.5 (*)    All other components within normal limits  URINALYSIS, ROUTINE W REFLEX MICROSCOPIC - Abnormal; Notable for the following components:   APPearance HAZY (*)    Hgb urine dipstick TRACE (*)    Ketones, ur 15 (*)    Leukocytes, UA SMALL (*)    All other components within normal limits  COMPREHENSIVE METABOLIC PANEL - Abnormal; Notable for the following components:   Potassium 3.4 (*)    Glucose, Bld 116 (*)    BUN 21 (*)    Total Bilirubin 1.3 (*)    GFR calc non Af Amer 55 (*)    All other components within normal limits  URINALYSIS, MICROSCOPIC (REFLEX) - Abnormal; Notable for the following components:   Bacteria, UA MANY (*)    Squamous Epithelial / LPF 0-5 (*)    All other components within normal limits    EKG  EKG Interpretation None       Radiology No results found.  Procedures Procedures (including critical care time)  Medications Ordered in ED Medications  sodium chloride 0.9 % bolus 1,000 mL (0 mLs Intravenous Stopped 04/20/17 1416)  sodium chloride 0.9 % bolus 1,000 mL (1,000 mLs Intravenous New Bag/Given 04/20/17 1445)     Initial Impression / Assessment and Plan / ED Course  I have reviewed the triage vital  signs and the nursing notes.  Pertinent labs & imaging results that were available during my care of the patient were reviewed by me and considered in my medical decision making (see chart for details).     Pt is feeling much better.  She does have bacteria in urine, but no signs/sx of UTI.  She is encouraged to return if worse and to f/u with pcp.  Final Clinical Impressions(s) / ED Diagnoses   Final diagnoses:  Diarrhea, unspecified type  Dehydration    ED Discharge Orders    None       Isla Pence, MD 04/20/17 1458

## 2017-04-20 NOTE — ED Notes (Signed)
Pt. Said she ate a [employer], therapeutic and frosty at [place] on Sunday after church

## 2017-06-02 ENCOUNTER — Other Ambulatory Visit: Payer: Self-pay

## 2017-06-02 ENCOUNTER — Emergency Department (HOSPITAL_BASED_OUTPATIENT_CLINIC_OR_DEPARTMENT_OTHER)
Admission: EM | Admit: 2017-06-02 | Discharge: 2017-06-02 | Disposition: A | Discharge status: Home / Self Care | Payer: Medicare Other | Attending: Emergency Medicine | Admitting: Emergency Medicine

## 2017-06-02 ENCOUNTER — Emergency Department (HOSPITAL_BASED_OUTPATIENT_CLINIC_OR_DEPARTMENT_OTHER): Payer: Medicare Other

## 2017-06-02 ENCOUNTER — Encounter (HOSPITAL_BASED_OUTPATIENT_CLINIC_OR_DEPARTMENT_OTHER): Payer: Self-pay | Admitting: Emergency Medicine

## 2017-06-02 DIAGNOSIS — Z23 Encounter for immunization: Secondary | ICD-10-CM | POA: Insufficient documentation

## 2017-06-02 DIAGNOSIS — Z79899 Other long term (current) drug therapy: Secondary | ICD-10-CM | POA: Insufficient documentation

## 2017-06-02 DIAGNOSIS — Y939 Activity, unspecified: Secondary | ICD-10-CM | POA: Diagnosis not present

## 2017-06-02 DIAGNOSIS — M7989 Other specified soft tissue disorders: Secondary | ICD-10-CM | POA: Diagnosis not present

## 2017-06-02 DIAGNOSIS — S61412A Laceration without foreign body of left hand, initial encounter: Secondary | ICD-10-CM | POA: Diagnosis not present

## 2017-06-02 DIAGNOSIS — Y92009 Unspecified place in unspecified non-institutional (private) residence as the place of occurrence of the external cause: Secondary | ICD-10-CM | POA: Diagnosis not present

## 2017-06-02 DIAGNOSIS — S61452A Open bite of left hand, initial encounter: Secondary | ICD-10-CM | POA: Insufficient documentation

## 2017-06-02 DIAGNOSIS — Z85828 Personal history of other malignant neoplasm of skin: Secondary | ICD-10-CM | POA: Insufficient documentation

## 2017-06-02 DIAGNOSIS — Y999 Unspecified external cause status: Secondary | ICD-10-CM | POA: Diagnosis not present

## 2017-06-02 DIAGNOSIS — W540XXA Bitten by dog, initial encounter: Secondary | ICD-10-CM | POA: Diagnosis not present

## 2017-06-02 DIAGNOSIS — I10 Essential (primary) hypertension: Secondary | ICD-10-CM | POA: Diagnosis not present

## 2017-06-02 MED ORDER — HYDROCODONE-ACETAMINOPHEN 5-325 MG PO TABS: 1.0000 | ORAL_TABLET | Freq: Four times a day (QID) | ORAL | 0 refills | Status: AC | PRN

## 2017-06-02 MED ORDER — AMOXICILLIN-POT CLAVULANATE 875-125 MG PO TABS: 1.0000 | ORAL_TABLET | Freq: Two times a day (BID) | ORAL | 0 refills | Status: AC

## 2017-06-02 MED ORDER — HYDROCODONE-ACETAMINOPHEN 5-325 MG PO TABS
2.0000 | ORAL_TABLET | Freq: Once | ORAL | Status: AC
  Administered 2017-06-02: 2 via ORAL
  Filled 2017-06-02: qty 2

## 2017-06-02 MED ORDER — AMOXICILLIN-POT CLAVULANATE 875-125 MG PO TABS
1.0000 | ORAL_TABLET | Freq: Two times a day (BID) | ORAL | Status: DC
  Administered 2017-06-02: 1 via ORAL
  Filled 2017-06-02: qty 1

## 2017-06-02 MED ORDER — TETANUS-DIPHTH-ACELL PERTUSSIS 5-2.5-18.5 LF-MCG/0.5 IM SUSP
0.5000 mL | Freq: Once | INTRAMUSCULAR | Status: AC
  Administered 2017-06-02: 0.5 mL via INTRAMUSCULAR
  Filled 2017-06-02: qty 0.5

## 2017-06-02 MED ORDER — BACITRACIN ZINC 500 UNIT/GM EX OINT
TOPICAL_OINTMENT | Freq: Once | CUTANEOUS | Status: AC
Start: 2017-06-02 — End: 2017-06-02
  Administered 2017-06-02: 12:00:00 via TOPICAL
  Filled 2017-06-02: qty 28.35

## 2017-06-02 NOTE — ED Provider Notes (Signed)
[facility] EMERGENCY DEPARTMENT Provider Note   CSN: [id number] Arrival date & time: 06/02/17  2263     History   Chief Complaint Chief Complaint  Patient presents with  . Animal Bite    HPI Cassandra Myers is a 82 y.o. female who presents for evaluation of dog bite to left hand that occurred yesterday.  Patient reports that she was at home when she was bitten by her own dog on the dorsal aspect of her left hand.  Patient reports that she did not seek any evaluation and attempted to provide wound care at home.  Patient states that she applied methylate to the hand.  Patient reports that over last night, the pain worsened.  Additionally, patient has had some swelling of the hand.  Patient reports that pain is worse in the index and middle finger of the left hand.  She reports pain with attempting to move them.  She reports limited range of motion secondary to pain.  Patient states that the dog is up-to-date on all his vaccines.  Patient states she does not know when her last tetanus shot was.  Patient denies any fevers, numbness/weakness.  The history is provided by the patient.    Past Medical History:  Diagnosis Date  . Arthritis    arthritis in feet  . Basal cell cancer    Nose  . Colon polyps   . Difficult intubation    2nd cousin result of anesthesia  . GERD (gastroesophageal reflux disease)   . H/O hiatal hernia   . Hypertension   . Ulcers of both great toes ([place])    duodenal [phone]    Patient Active Problem List   Diagnosis Date Noted  . Basal cell cancer   . Hypertension     Past Surgical History:  Procedure Laterality Date  . BALLOON DILATION N/A 10/02/2012   Procedure: BALLOON DILATION;  Surgeon: Garlan Fair, MD;  Location: [facility];  Service: Endoscopy;  Laterality: N/A;  . colonoscoy X2     polypectomy X2  . DILATION AND CURETTAGE OF UTERUS    . ESOPHAGOGASTRODUODENOSCOPY N/A 09/16/2012   Procedure: ESOPHAGOGASTRODUODENOSCOPY (EGD);   Surgeon: Winfield Cunas., MD;  Location: [facility] ENDOSCOPY;  Service: Endoscopy;  Laterality: N/A;  pat  . ESOPHAGOGASTRODUODENOSCOPY N/A 10/02/2012   Procedure: ESOPHAGOGASTRODUODENOSCOPY (EGD);  Surgeon: Garlan Fair, MD;  Location: [facility];  Service: Endoscopy;  Laterality: N/A;  No CARM per office  . Jaw tumor    . skin cancer excised    . TONSILLECTOMY       OB History    Gravida  0   Para      Term      Preterm      AB      Living        SAB      TAB      Ectopic      Multiple      Live Births               Home Medications    Prior to Admission medications   Medication Sig Start Date End Date Taking? Authorizing Provider  amoxicillin-clavulanate (AUGMENTIN) 875-125 MG tablet Take 1 tablet by mouth every 12 (twelve) hours. 06/02/17   Volanda Napoleon, PA-C  atorvastatin (LIPITOR) 10 MG tablet Take 10 mg by mouth daily. 09/27/12   [provider]  beta carotene w/minerals (OCUVITE) tablet Take 1 tablet by mouth daily.  [provider]  Cholecalciferol (VITAMIN D-3) 1000 UNITS CAPS Take 1,000 Units by mouth daily.    [provider]  HYDROcodone-acetaminophen (NORCO/VICODIN) 5-325 MG tablet Take 1-2 tablets by mouth every 6 (six) hours as needed. 06/02/17   Volanda Napoleon, PA-C  lisinopril (PRINIVIL,ZESTRIL) 10 MG tablet Take 10 mg by mouth daily.    [provider]  Multiple Vitamin (MULTIVITAMIN) tablet Take 1 tablet by mouth daily.    [provider]  omeprazole (PRILOSEC) 20 MG capsule Take 20 mg by mouth daily.    [provider]    Family History Family History  Problem Relation Age of Onset  . Hypertension Mother   . Stroke Mother   . Heart disease Mother   . Kidney failure Mother   . Hypertension Father   . Lung cancer Father     Social History Social History   Tobacco Use  . Smoking status: Never Smoker  . Smokeless tobacco: Never Used  Substance Use Topics  . Alcohol use:  No  . Drug use: No     Allergies   Patient has no known allergies.   Review of Systems Review of Systems  Constitutional: Negative for fever.  Skin: Positive for wound.  Neurological: Negative for weakness and numbness.     Physical Exam Updated Vital Signs BP (!) 144/59 (BP Location: Right Arm)   Pulse 98   Temp 98.4 F (36.9 C) (Oral)   Resp 18   Ht 5\' 4"  (1.626 m)   Wt 63.5 kg (140 lb)   SpO2 99%   BMI 24.03 kg/m   Physical Exam  Constitutional: She appears well-developed and well-nourished.  HENT:  Head: Normocephalic and atraumatic.  Eyes: Conjunctivae and EOM are normal. Right eye exhibits no discharge. Left eye exhibits no discharge. No scleral icterus.  Cardiovascular:  Pulses:      Radial pulses are 2+ on the right side, and 2+ on the left side.  Pulmonary/Chest: Effort normal.  Musculoskeletal:  Tenderness palpation noted to the dorsal aspect of the left hand with some surrounding soft tissue swelling and erythema.  Tenderness palpation noted to the left index and middle finger with some soft tissue swelling.  No overlying warmth, erythema.  Flexion/extension intact but with subjective reports of pain.  Patient can flex and extend with the DIP held in isolation.  Patient can make a fist but with subjective reports of pain.  No abnormalities of the right upper extremity.  Neurological: She is alert.  Sensation intact along major nerve distributions of BUE  Skin: Skin is warm and dry. Capillary refill takes less than 2 seconds.  1 cm wound noted to the dorsal aspect of the hand.  There is another small 0.5 cm wound just laterally.  Mild soft tissue swelling noted overlying the dorsal aspect of the left hand.  No surrounding warmth, erythema, purulent drainage.  Psychiatric: She has a normal mood and affect. Her speech is normal and behavior is normal.  Nursing note and vitals reviewed.    ED Treatments / Results  Labs (all labs ordered are listed, but only  abnormal results are displayed) Labs Reviewed - No data to display  EKG None  Radiology Dg Hand Complete Left  Result Date: 06/02/2017 CLINICAL DATA:  Dog bite to left hand EXAM: LEFT HAND - COMPLETE 3+ VIEW COMPARISON:  None. FINDINGS: Metallic ring obscures visualization in the proximal left fourth finger. Soft tissue swelling throughout the dorsum of the left hand. No fracture  or dislocation. No suspicious focal osseous lesion. Polyarticular mild-to-moderate osteoarthritis most prominent in the second DIP joint, fifth DIP joint and first carpometacarpal joint. IMPRESSION: Soft tissue swelling throughout the dorsum of the left hand, with no fracture or dislocation. 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 Electronically Signed   By: Ilona Sorrel M.D.   On: 06/02/2017 10:50    Procedures .Foreign Body Removal Date/Time: 06/02/2017 12:19 PM Performed by: Volanda Napoleon, PA-C Authorized by: Volanda Napoleon, PA-C  Consent: Verbal consent obtained. Consent given by: patient Patient understanding: patient states understanding of the procedure being performed Patient consent: the patient's understanding of the procedure matches consent given Procedure consent: procedure consent matches procedure scheduled Relevant documents: relevant documents present and verified Test results: test results available and properly labeled Site marked: the operative site was marked Imaging studies: imaging studies available Patient identity confirmed: verbally with patient Time out: Immediately prior to procedure a "time out" was called to verify the correct patient, procedure, equipment, support staff and site/side marked as required. Intake: finger. Patient restrained: no Patient cooperative: yes Complexity: simple 2 objects recovered. Objects recovered: rings Post-procedure assessment: foreign body removed Patient tolerance: Patient tolerated the procedure well with no immediate  complications Comments: 2 rings were removed from patient's fourth left finger using the electric ring cutter.  Patient tolerated procedure well without any difficulty. Irrigation and debridement Date/Time: 06/02/2017 12:20 PM Performed by: Volanda Napoleon, PA-C Authorized by: Volanda Napoleon, PA-C  Consent: Verbal consent obtained. Consent given by: patient Patient understanding: patient states understanding of the procedure being performed Patient consent: the patient's understanding of the procedure matches consent given Procedure consent: procedure consent matches procedure scheduled Relevant documents: relevant documents present and verified Test results: test results available and properly labeled Site marked: the operative site was marked Imaging studies: imaging studies available Patient identity confirmed: verbally with patient Preparation: Patient was prepped and draped in the usual sterile fashion. Patient tolerance: Patient tolerated the procedure well with no immediate complications    (including critical care time)  Medications Ordered in ED Medications  Tdap (BOOSTRIX) injection 0.5 mL (0.5 mLs Intramuscular Given 06/02/17 1216)  HYDROcodone-acetaminophen (NORCO/VICODIN) 5-325 MG per tablet 2 tablet (2 tablets Oral Given 06/02/17 1217)  bacitracin ointment ( Topical Given 06/02/17 1216)     Initial Impression / Assessment and Plan / ED Course  I have reviewed the triage vital signs and the nursing notes.  Pertinent labs & imaging results that were available during my care of the patient were reviewed by me and considered in my medical decision making (see chart for details).     82 year old female who presents for evaluation of dog bite to dorsal aspect of left hand that began last night.  Patient reports she was bitten by her own dog.  She reports that her dog is up-to-date on vaccines.  Patient reports she does not know when her last tetanus shot was.  Patient  reports that she has had continued pain and  swelling to the hand since the incident.  Patient denies any fevers, numbness/weakness. Patient is afebrile, non-toxic appearing, sitting comfortably on examination table.  Vital signs reviewed and stable.  Patient is neurovascularly intact.  Patient with 2 small wounds noted to the dorsal aspect of the left hand.  There is some surrounding soft tissue swelling but no warmth or erythema.  No drainage from the wound sites.  Patient does have some pain and swelling noted to the index and middle finger with  no surrounding warmth, erythema.  History/physical exam is not concerning for flexor tenosynovitis.  Plan to check x-ray for evaluation of any bony abnormalities, foreign body.  We will plan to provide wound care.  Plan to update tetanus here in the ED.  Attempted to remove rings with surgery but was unsuccessful.  We will plan to remove them with ring cutter.  X-ray reviewed.  Negative for any acute bony abnormality.  Discussed results with patient.  Rings removed with a ring cutter as documented above.  Patient tolerated procedure without any difficulty.  Irrigation and debridement of the wounds was done with in the ED with sterile saline.  Wounds were extensively irrigated with sterile saline.  Given that this is a dog bite and it has been more than 12 hours since the initial incident, will not plan for sutures here in the ED.  And for secondary closure.  We will plan to start patient on antibiotic's.  Patient with no known allergies.  Instructed patient that she will need to follow-up with referred hand doctor for further evaluation.  Patient instructed on wound care precautions and instructed to return to the emergency department immediately if she experiences any worsening symptoms. Patient had ample opportunity for questions and discussion. All patient's questions were answered with full understanding. Strict return precautions discussed. Patient expresses  understanding and agreement to plan.   Final Clinical Impressions(s) / ED Diagnoses   Final diagnoses:  Dog bite, initial encounter    ED Discharge Orders        Ordered    amoxicillin-clavulanate (AUGMENTIN) 875-125 MG tablet  Every 12 hours     06/02/17 1218    HYDROcodone-acetaminophen (NORCO/VICODIN) 5-325 MG tablet  Every 6 hours PRN     06/02/17 1218       Volanda Napoleon, PA-C 06/02/17 1555    Blanchie Dessert, MD 06/02/17 2021

## 2017-06-02 NOTE — Discharge Instructions (Signed)
Take antibiotics as directed. Please take all of your antibiotics until finished.  You can take Tylenol 1000 mg 3 times a day for pain.  Do not exceed 4000 mg of Tylenol.  He can take pain medication for severe breakthrough pain.  Keep the wound clean and dry for the first 24 hours. After that you may gently clean the wound with soap and water. Make sure to pat dry the wound before covering it with any dressing. You can use topical antibiotic ointment and bandage. Ice and elevate for pain relief.   As we discussed, please follow-up with referred hand doctor.  Call their office and arrange for an appointment.  Monitor closely for any signs of infection. Return to the Emergency Department for any worsening redness/swelling of the area that begins to spread, drainage from the site, worsening pain, fever or any other worsening or concerning symptoms.

## 2017-06-02 NOTE — ED Triage Notes (Signed)
Patient states that she was bit by her dog yesterday to the left hand. She tried to take care of hit at home. The patient was bit by her smaller dog. The patient put methylate to her hand  - fingers swollen

## 2017-06-02 NOTE — ED Notes (Signed)
Patient transported to X-ray 

## 2017-06-05 DIAGNOSIS — S61452A Open bite of left hand, initial encounter: Secondary | ICD-10-CM | POA: Diagnosis not present

## 2017-06-07 DIAGNOSIS — S61452A Open bite of left hand, initial encounter: Secondary | ICD-10-CM | POA: Diagnosis not present

## 2017-06-11 DIAGNOSIS — W19XXXA Unspecified fall, initial encounter: Secondary | ICD-10-CM | POA: Diagnosis not present

## 2017-06-11 DIAGNOSIS — T148XXA Other injury of unspecified body region, initial encounter: Secondary | ICD-10-CM | POA: Diagnosis not present

## 2017-06-11 DIAGNOSIS — S8011XA Contusion of right lower leg, initial encounter: Secondary | ICD-10-CM | POA: Diagnosis not present

## 2017-06-14 DIAGNOSIS — L905 Scar conditions and fibrosis of skin: Secondary | ICD-10-CM | POA: Diagnosis not present

## 2017-06-14 DIAGNOSIS — L570 Actinic keratosis: Secondary | ICD-10-CM | POA: Diagnosis not present

## 2017-06-14 DIAGNOSIS — Z85828 Personal history of other malignant neoplasm of skin: Secondary | ICD-10-CM | POA: Diagnosis not present

## 2017-06-14 DIAGNOSIS — S61542A Puncture wound with foreign body of left wrist, initial encounter: Secondary | ICD-10-CM | POA: Diagnosis not present

## 2017-06-27 DIAGNOSIS — S81801D Unspecified open wound, right lower leg, subsequent encounter: Secondary | ICD-10-CM | POA: Diagnosis not present

## 2017-06-27 DIAGNOSIS — S8991XD Unspecified injury of right lower leg, subsequent encounter: Secondary | ICD-10-CM | POA: Diagnosis not present

## 2017-06-28 ENCOUNTER — Other Ambulatory Visit: Payer: Self-pay | Admitting: Internal Medicine

## 2017-06-28 ENCOUNTER — Ambulatory Visit
Admission: RE | Admit: 2017-06-28 | Discharge: 2017-06-28 | Disposition: A | Discharge status: Home / Self Care | Payer: Medicare Other | Source: Ambulatory Visit | Attending: Internal Medicine | Admitting: Internal Medicine

## 2017-06-28 DIAGNOSIS — M79604 Pain in right leg: Secondary | ICD-10-CM

## 2017-06-28 DIAGNOSIS — S8992XA Unspecified injury of left lower leg, initial encounter: Secondary | ICD-10-CM

## 2017-06-28 DIAGNOSIS — M79661 Pain in right lower leg: Secondary | ICD-10-CM | POA: Diagnosis not present

## 2017-06-28 DIAGNOSIS — S81801D Unspecified open wound, right lower leg, subsequent encounter: Secondary | ICD-10-CM | POA: Diagnosis not present

## 2017-06-28 DIAGNOSIS — S8991XA Unspecified injury of right lower leg, initial encounter: Secondary | ICD-10-CM | POA: Diagnosis not present

## 2017-06-28 DIAGNOSIS — M7989 Other specified soft tissue disorders: Secondary | ICD-10-CM | POA: Diagnosis not present

## 2017-07-16 DIAGNOSIS — A084 Viral intestinal infection, unspecified: Secondary | ICD-10-CM | POA: Diagnosis not present

## 2017-11-29 DIAGNOSIS — I129 Hypertensive chronic kidney disease with stage 1 through stage 4 chronic kidney disease, or unspecified chronic kidney disease: Secondary | ICD-10-CM | POA: Diagnosis not present

## 2017-11-29 DIAGNOSIS — N183 Chronic kidney disease, stage 3 (moderate): Secondary | ICD-10-CM | POA: Diagnosis not present

## 2017-11-29 DIAGNOSIS — Z1389 Encounter for screening for other disorder: Secondary | ICD-10-CM | POA: Diagnosis not present

## 2017-11-29 DIAGNOSIS — Z0000 Encounter for general adult medical examination without abnormal findings: Secondary | ICD-10-CM | POA: Diagnosis not present

## 2017-11-29 DIAGNOSIS — K219 Gastro-esophageal reflux disease without esophagitis: Secondary | ICD-10-CM | POA: Diagnosis not present

## 2017-11-29 DIAGNOSIS — Z23 Encounter for immunization: Secondary | ICD-10-CM | POA: Diagnosis not present

## 2017-12-17 DIAGNOSIS — L905 Scar conditions and fibrosis of skin: Secondary | ICD-10-CM | POA: Diagnosis not present

## 2017-12-17 DIAGNOSIS — L570 Actinic keratosis: Secondary | ICD-10-CM | POA: Diagnosis not present

## 2017-12-17 DIAGNOSIS — Z85828 Personal history of other malignant neoplasm of skin: Secondary | ICD-10-CM | POA: Diagnosis not present

## 2018-02-27 DIAGNOSIS — M62838 Other muscle spasm: Secondary | ICD-10-CM | POA: Diagnosis not present

## 2018-02-27 DIAGNOSIS — T148XXA Other injury of unspecified body region, initial encounter: Secondary | ICD-10-CM | POA: Diagnosis not present

## 2018-09-15 DIAGNOSIS — Z23 Encounter for immunization: Secondary | ICD-10-CM | POA: Diagnosis not present

## 2018-12-05 DIAGNOSIS — K219 Gastro-esophageal reflux disease without esophagitis: Secondary | ICD-10-CM | POA: Diagnosis not present

## 2018-12-05 DIAGNOSIS — N183 Chronic kidney disease, stage 3 unspecified: Secondary | ICD-10-CM | POA: Diagnosis not present

## 2018-12-05 DIAGNOSIS — Z1389 Encounter for screening for other disorder: Secondary | ICD-10-CM | POA: Diagnosis not present

## 2018-12-05 DIAGNOSIS — Z1231 Encounter for screening mammogram for malignant neoplasm of breast: Secondary | ICD-10-CM | POA: Diagnosis not present

## 2018-12-05 DIAGNOSIS — I129 Hypertensive chronic kidney disease with stage 1 through stage 4 chronic kidney disease, or unspecified chronic kidney disease: Secondary | ICD-10-CM | POA: Diagnosis not present

## 2018-12-05 DIAGNOSIS — Z23 Encounter for immunization: Secondary | ICD-10-CM | POA: Diagnosis not present

## 2018-12-05 DIAGNOSIS — Z0000 Encounter for general adult medical examination without abnormal findings: Secondary | ICD-10-CM | POA: Diagnosis not present

## 2018-12-09 DIAGNOSIS — Z1231 Encounter for screening mammogram for malignant neoplasm of breast: Secondary | ICD-10-CM | POA: Diagnosis not present

## 2019-03-01 ENCOUNTER — Ambulatory Visit: Payer: BLUE CROSS/BLUE SHIELD

## 2019-03-07 ENCOUNTER — Ambulatory Visit: Payer: BLUE CROSS/BLUE SHIELD

## 2019-05-06 DIAGNOSIS — L570 Actinic keratosis: Secondary | ICD-10-CM | POA: Diagnosis not present

## 2019-05-06 DIAGNOSIS — L720 Epidermal cyst: Secondary | ICD-10-CM | POA: Diagnosis not present

## 2019-05-06 DIAGNOSIS — L728 Other follicular cysts of the skin and subcutaneous tissue: Secondary | ICD-10-CM | POA: Diagnosis not present

## 2019-05-15 IMAGING — CR DG HAND COMPLETE 3+V*L*
3 series · 3 of 3 positions shown · non-contrast
Comparison: None.

CLINICAL DATA: Dog bite to left hand

EXAM:
LEFT HAND - COMPLETE 3+ VIEW

[x hand pa left]
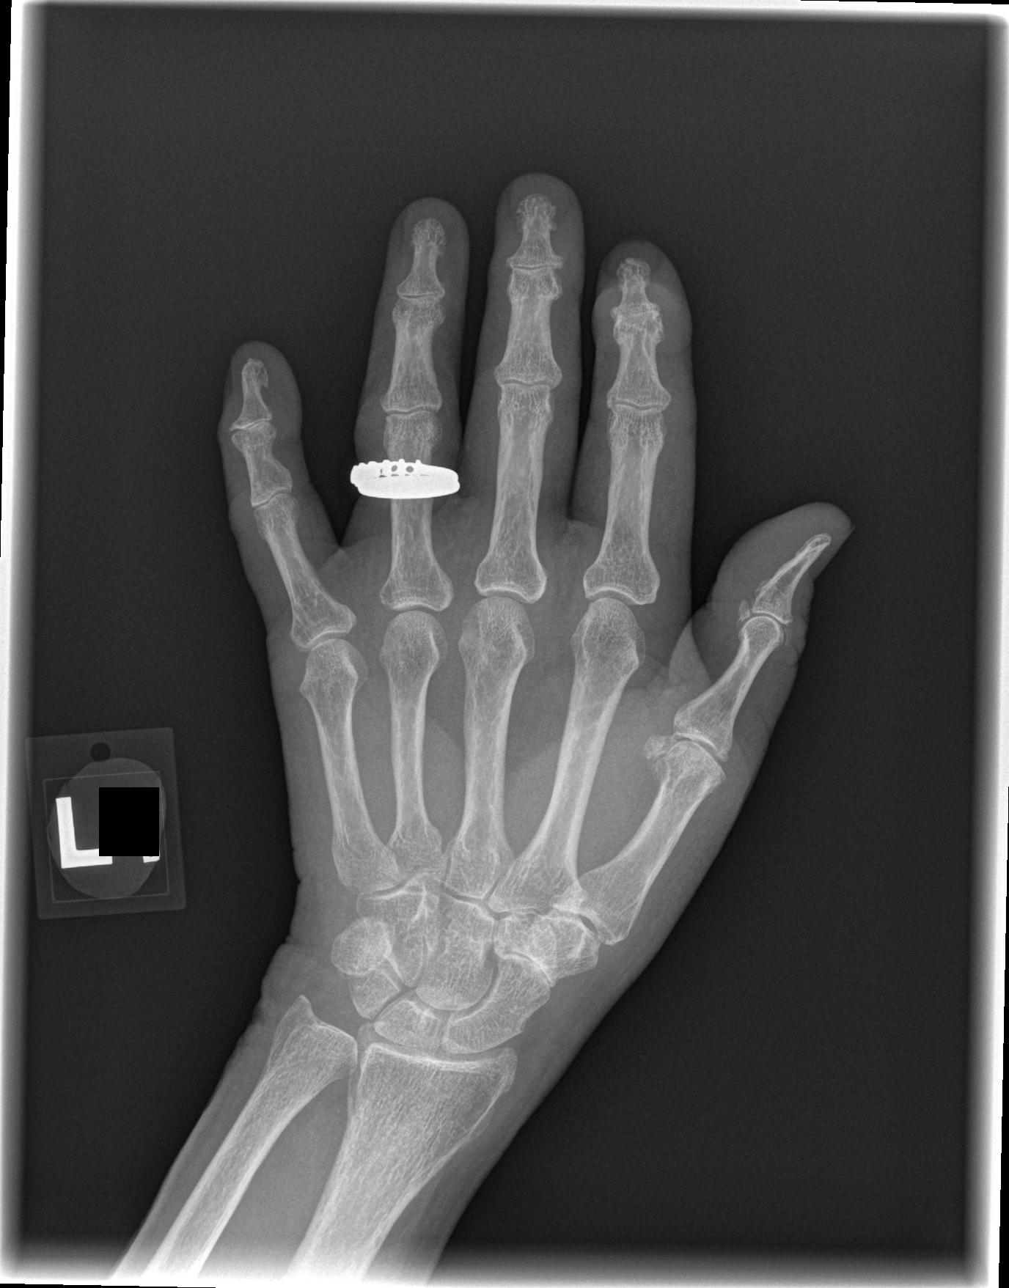

[x hand oblique left]
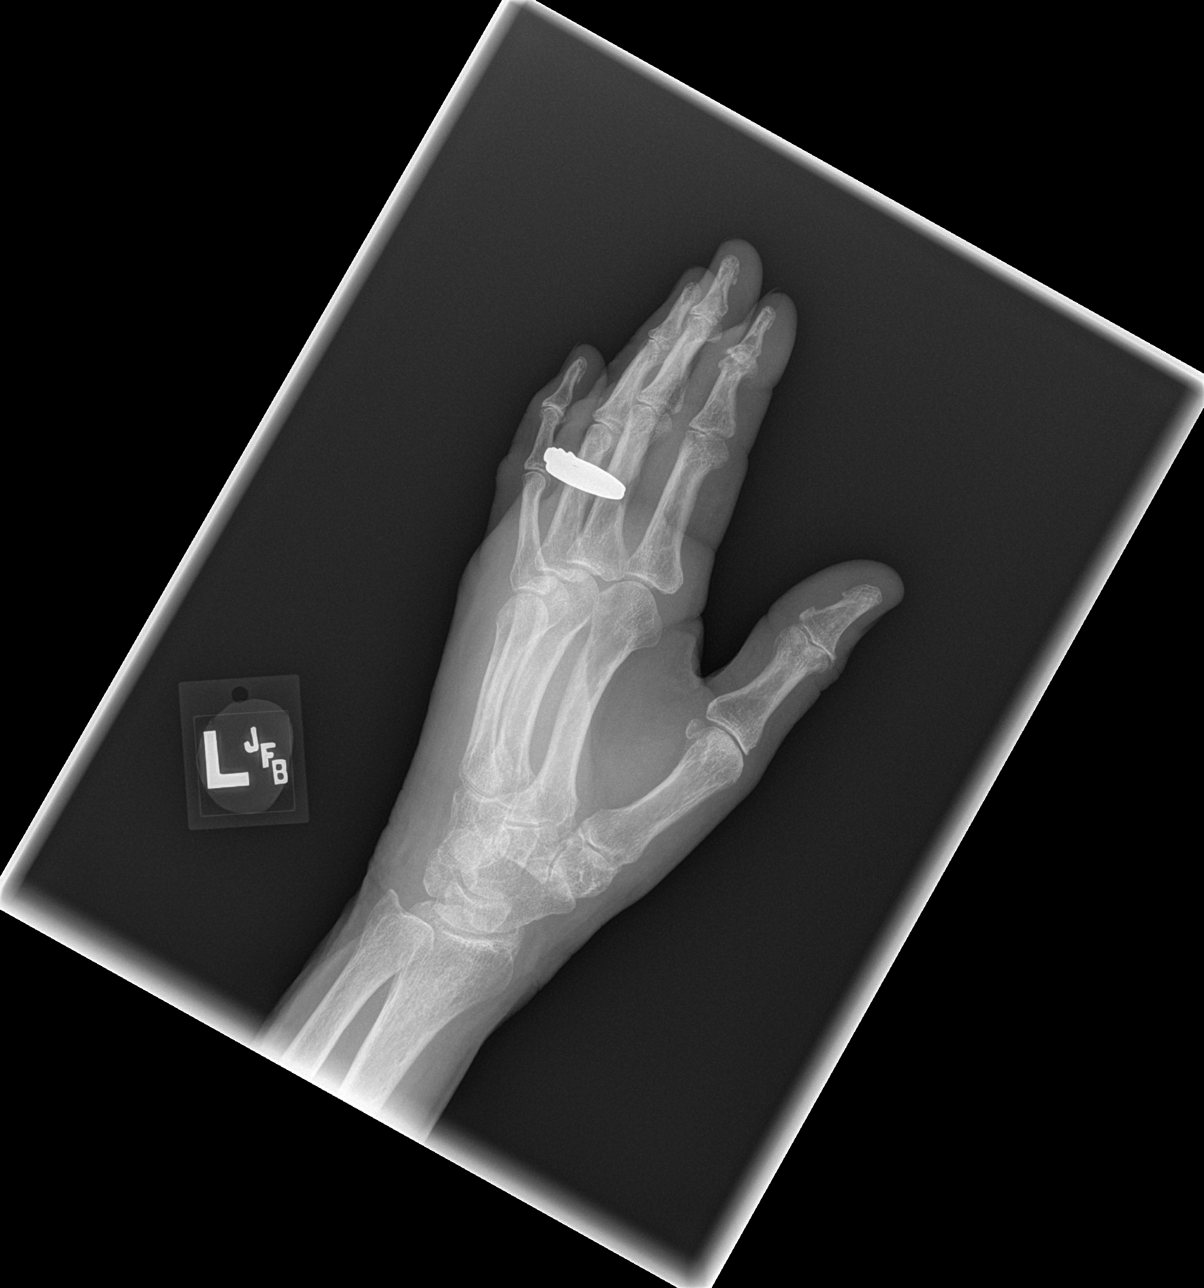

[x hand lat left]
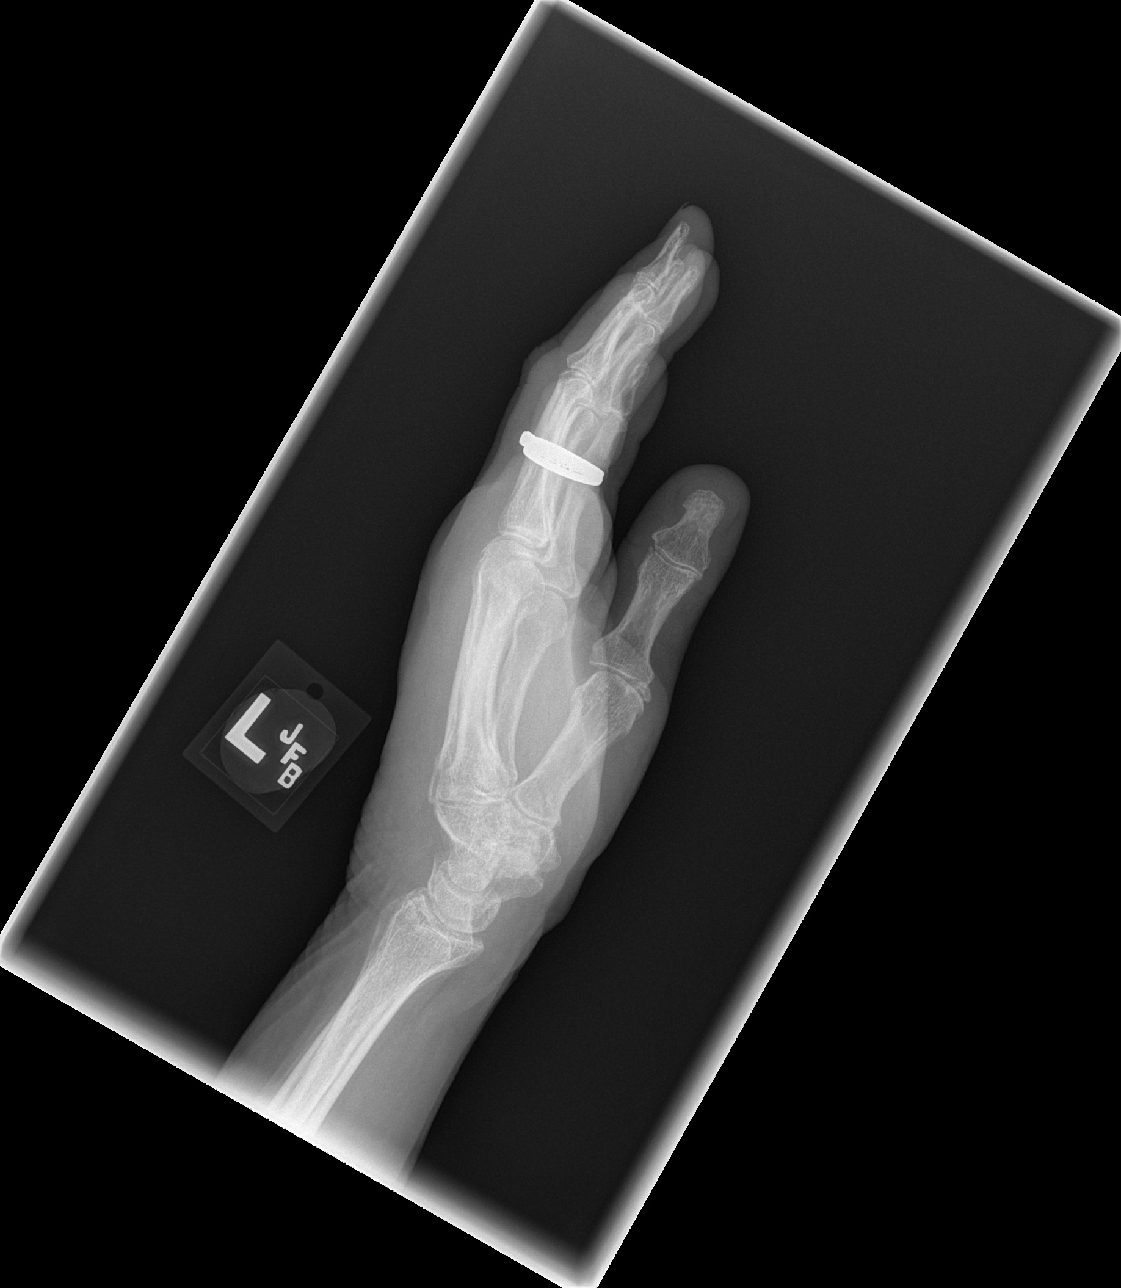

[3 of 3 positions shown; findings below may reference images not displayed]

FINDINGS: Metallic ring obscures visualization in the proximal left fourth
finger. Soft tissue swelling throughout the dorsum of the left hand.
No fracture or dislocation. No suspicious focal osseous lesion.
Polyarticular mild-to-moderate osteoarthritis most prominent in the
second DIP joint, fifth DIP joint and first carpometacarpal joint.
IMPRESSION: Soft tissue swelling throughout the dorsum of the left hand, with no
fracture or dislocation. Mild to moderate polyarticular
osteoarthritis.

## 2019-06-10 IMAGING — DX DG TIBIA/FIBULA 2V*R*
2 series · 2 of 2 positions shown · non-contrast
Comparison: None.

CLINICAL DATA: Pain in the right lower extremity after injury.
Initial encounter.

EXAM:
RIGHT TIBIA AND FIBULA - 2 VIEW

[dg tibia/fibula right (1 of 2)]
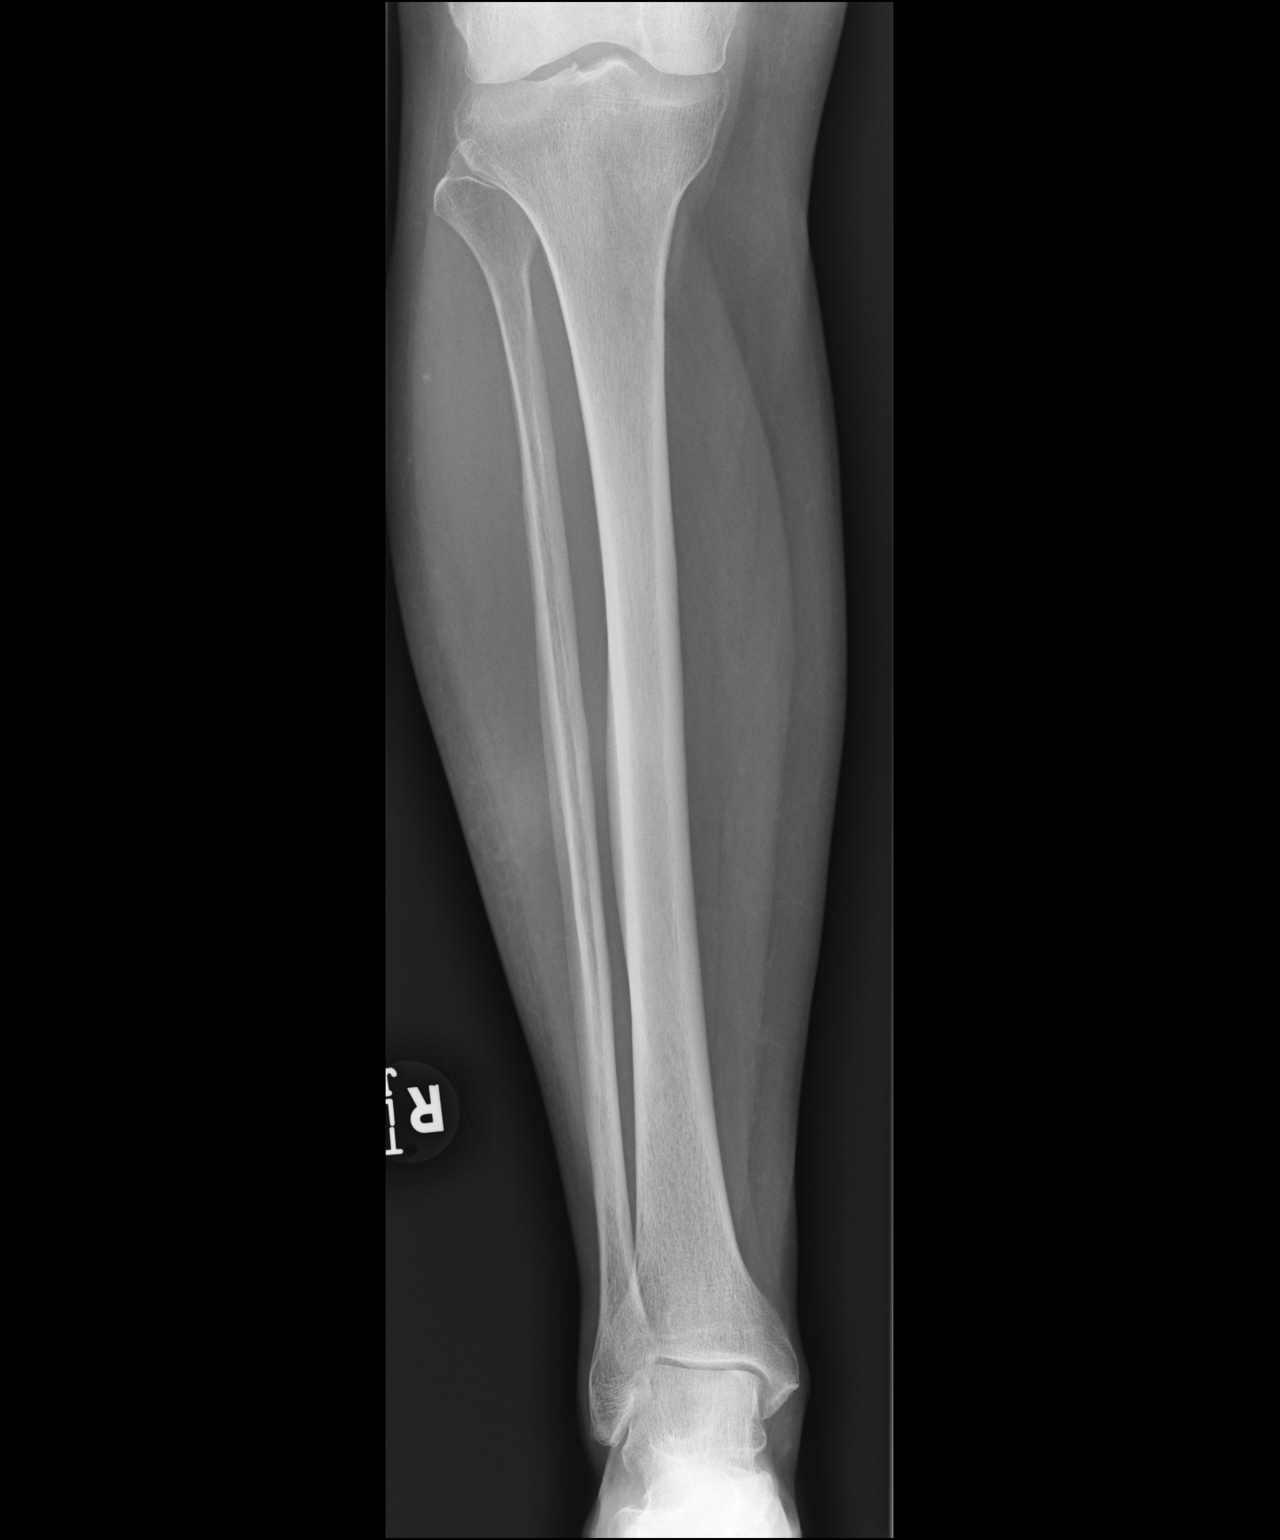

[dg tibia/fibula right (2 of 2)]
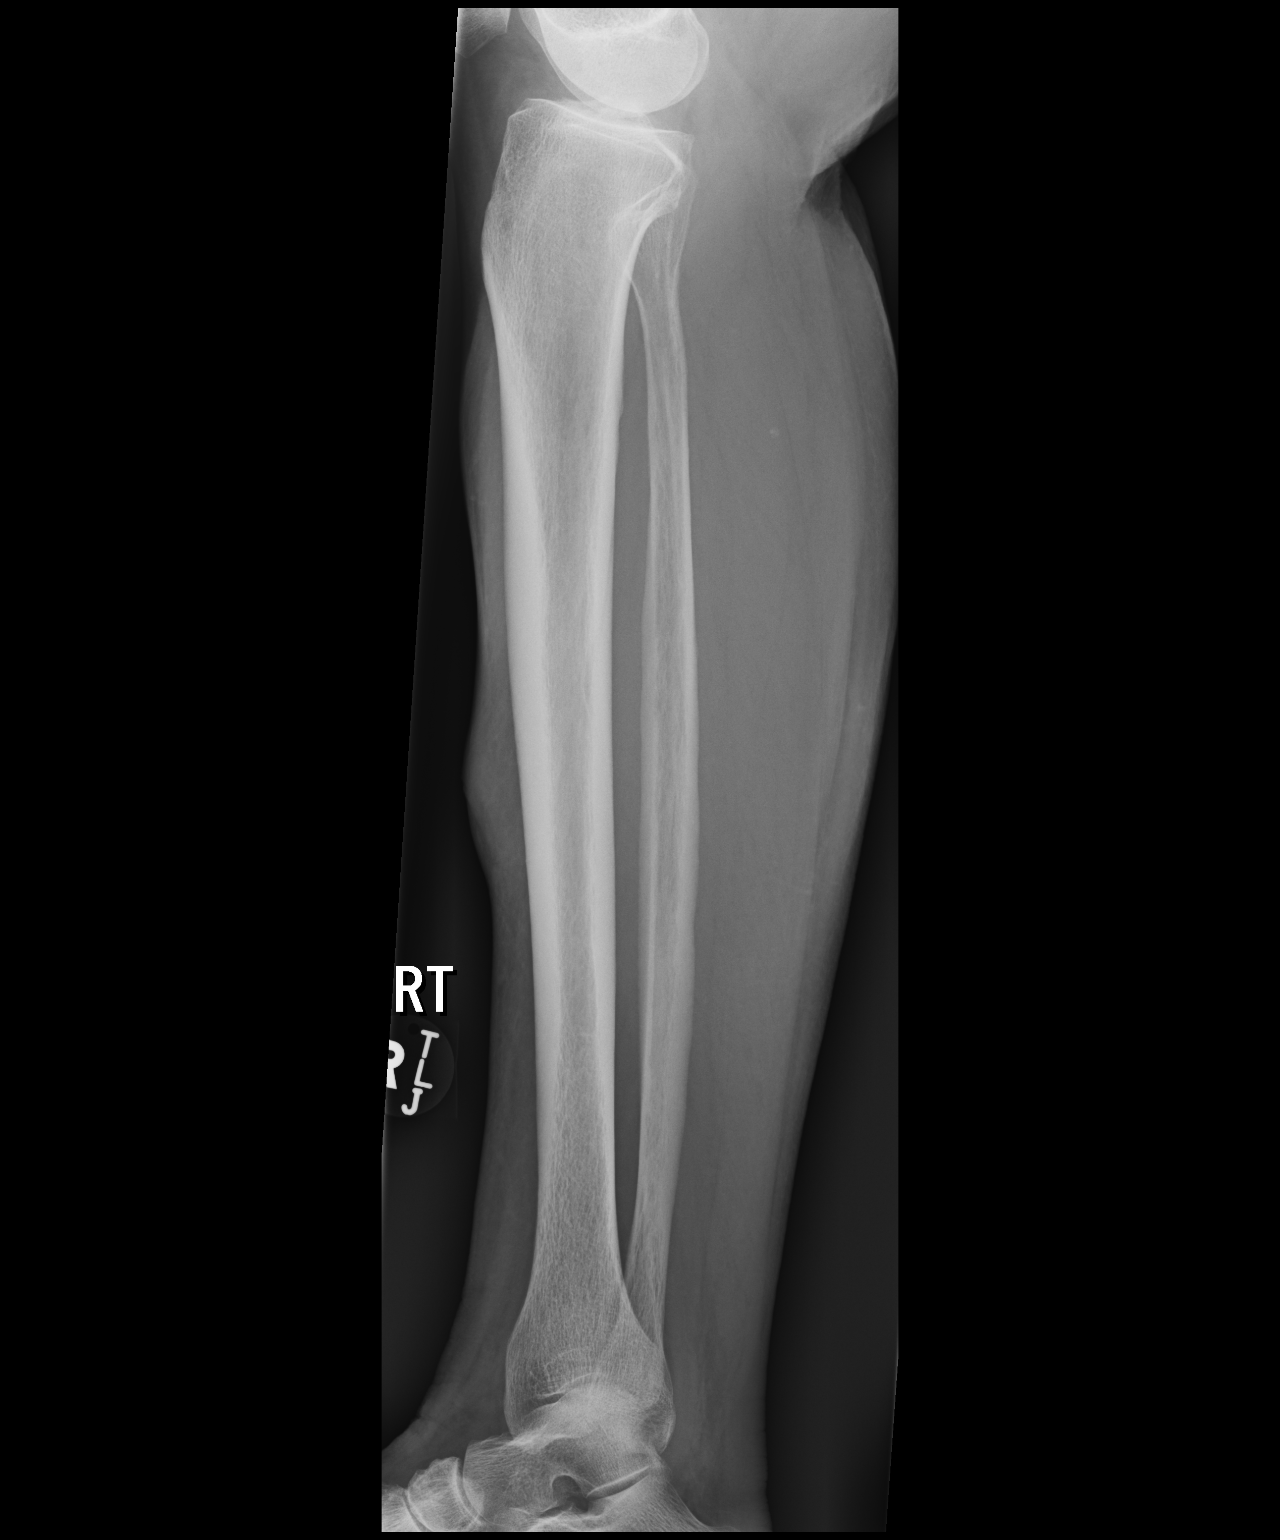

[2 of 2 positions shown; findings below may reference images not displayed]

FINDINGS: Focal soft tissue swelling over the shin. Negative for fracture or
opaque foreign body.
IMPRESSION: Soft tissue swelling without fracture or opaque foreign body.

## 2019-08-05 DIAGNOSIS — Z961 Presence of intraocular lens: Secondary | ICD-10-CM | POA: Diagnosis not present

## 2019-08-05 DIAGNOSIS — H2511 Age-related nuclear cataract, right eye: Secondary | ICD-10-CM | POA: Diagnosis not present

## 2019-08-05 DIAGNOSIS — H18413 Arcus senilis, bilateral: Secondary | ICD-10-CM | POA: Diagnosis not present

## 2019-08-05 DIAGNOSIS — H353131 Nonexudative age-related macular degeneration, bilateral, early dry stage: Secondary | ICD-10-CM | POA: Diagnosis not present

## 2019-08-05 DIAGNOSIS — H25041 Posterior subcapsular polar age-related cataract, right eye: Secondary | ICD-10-CM | POA: Diagnosis not present

## 2019-08-05 DIAGNOSIS — H25011 Cortical age-related cataract, right eye: Secondary | ICD-10-CM | POA: Diagnosis not present

## 2019-09-22 DIAGNOSIS — Z961 Presence of intraocular lens: Secondary | ICD-10-CM | POA: Diagnosis not present

## 2019-09-22 DIAGNOSIS — H2511 Age-related nuclear cataract, right eye: Secondary | ICD-10-CM | POA: Diagnosis not present

## 2019-09-22 DIAGNOSIS — H524 Presbyopia: Secondary | ICD-10-CM | POA: Diagnosis not present

## 2019-09-22 DIAGNOSIS — H52222 Regular astigmatism, left eye: Secondary | ICD-10-CM | POA: Diagnosis not present

## 2019-09-22 DIAGNOSIS — H5212 Myopia, left eye: Secondary | ICD-10-CM | POA: Diagnosis not present

## 2019-10-13 DIAGNOSIS — Z20822 Contact with and (suspected) exposure to covid-19: Secondary | ICD-10-CM | POA: Diagnosis not present

## 2019-10-14 DIAGNOSIS — Z20822 Contact with and (suspected) exposure to covid-19: Secondary | ICD-10-CM | POA: Diagnosis not present

## 2019-11-07 DIAGNOSIS — Z23 Encounter for immunization: Secondary | ICD-10-CM | POA: Diagnosis not present

## 2019-11-21 DIAGNOSIS — Z23 Encounter for immunization: Secondary | ICD-10-CM | POA: Diagnosis not present

## 2019-12-09 DIAGNOSIS — K219 Gastro-esophageal reflux disease without esophagitis: Secondary | ICD-10-CM | POA: Diagnosis not present

## 2019-12-09 DIAGNOSIS — Z0000 Encounter for general adult medical examination without abnormal findings: Secondary | ICD-10-CM | POA: Diagnosis not present

## 2019-12-09 DIAGNOSIS — Z1389 Encounter for screening for other disorder: Secondary | ICD-10-CM | POA: Diagnosis not present

## 2019-12-09 DIAGNOSIS — I129 Hypertensive chronic kidney disease with stage 1 through stage 4 chronic kidney disease, or unspecified chronic kidney disease: Secondary | ICD-10-CM | POA: Diagnosis not present

## 2019-12-09 DIAGNOSIS — E782 Mixed hyperlipidemia: Secondary | ICD-10-CM | POA: Diagnosis not present

## 2019-12-09 DIAGNOSIS — N1831 Chronic kidney disease, stage 3a: Secondary | ICD-10-CM | POA: Diagnosis not present

## 2020-05-04 DIAGNOSIS — L570 Actinic keratosis: Secondary | ICD-10-CM | POA: Diagnosis not present

## 2020-05-04 DIAGNOSIS — L578 Other skin changes due to chronic exposure to nonionizing radiation: Secondary | ICD-10-CM | POA: Diagnosis not present

## 2020-05-04 DIAGNOSIS — D485 Neoplasm of uncertain behavior of skin: Secondary | ICD-10-CM | POA: Diagnosis not present

## 2020-05-04 DIAGNOSIS — C4442 Squamous cell carcinoma of skin of scalp and neck: Secondary | ICD-10-CM | POA: Diagnosis not present

## 2020-05-06 DIAGNOSIS — Z23 Encounter for immunization: Secondary | ICD-10-CM | POA: Diagnosis not present

## 2020-05-31 DIAGNOSIS — L578 Other skin changes due to chronic exposure to nonionizing radiation: Secondary | ICD-10-CM | POA: Diagnosis not present

## 2020-05-31 DIAGNOSIS — L080 Pyoderma: Secondary | ICD-10-CM | POA: Diagnosis not present

## 2020-05-31 DIAGNOSIS — L249 Irritant contact dermatitis, unspecified cause: Secondary | ICD-10-CM | POA: Diagnosis not present

## 2020-07-01 DIAGNOSIS — H26492 Other secondary cataract, left eye: Secondary | ICD-10-CM | POA: Diagnosis not present

## 2020-07-01 DIAGNOSIS — Z961 Presence of intraocular lens: Secondary | ICD-10-CM | POA: Diagnosis not present

## 2020-07-01 DIAGNOSIS — H524 Presbyopia: Secondary | ICD-10-CM | POA: Diagnosis not present

## 2020-08-06 DIAGNOSIS — H26492 Other secondary cataract, left eye: Secondary | ICD-10-CM | POA: Diagnosis not present

## 2020-08-06 DIAGNOSIS — H18413 Arcus senilis, bilateral: Secondary | ICD-10-CM | POA: Diagnosis not present

## 2020-08-06 DIAGNOSIS — H353131 Nonexudative age-related macular degeneration, bilateral, early dry stage: Secondary | ICD-10-CM | POA: Diagnosis not present

## 2020-08-06 DIAGNOSIS — Z961 Presence of intraocular lens: Secondary | ICD-10-CM | POA: Diagnosis not present

## 2020-09-20 DIAGNOSIS — D485 Neoplasm of uncertain behavior of skin: Secondary | ICD-10-CM | POA: Diagnosis not present

## 2020-09-20 DIAGNOSIS — L570 Actinic keratosis: Secondary | ICD-10-CM | POA: Diagnosis not present

## 2020-09-20 DIAGNOSIS — D1801 Hemangioma of skin and subcutaneous tissue: Secondary | ICD-10-CM | POA: Diagnosis not present

## 2020-09-20 DIAGNOSIS — L738 Other specified follicular disorders: Secondary | ICD-10-CM | POA: Diagnosis not present

## 2020-09-20 DIAGNOSIS — L905 Scar conditions and fibrosis of skin: Secondary | ICD-10-CM | POA: Diagnosis not present

## 2020-09-20 DIAGNOSIS — L820 Inflamed seborrheic keratosis: Secondary | ICD-10-CM | POA: Diagnosis not present

## 2020-09-20 DIAGNOSIS — Z85828 Personal history of other malignant neoplasm of skin: Secondary | ICD-10-CM | POA: Diagnosis not present

## 2020-10-29 DIAGNOSIS — Z23 Encounter for immunization: Secondary | ICD-10-CM | POA: Diagnosis not present

## 2020-11-19 DIAGNOSIS — Z23 Encounter for immunization: Secondary | ICD-10-CM | POA: Diagnosis not present

## 2020-12-09 DIAGNOSIS — R030 Elevated blood-pressure reading, without diagnosis of hypertension: Secondary | ICD-10-CM | POA: Diagnosis not present

## 2020-12-09 DIAGNOSIS — Z1389 Encounter for screening for other disorder: Secondary | ICD-10-CM | POA: Diagnosis not present

## 2020-12-09 DIAGNOSIS — N1831 Chronic kidney disease, stage 3a: Secondary | ICD-10-CM | POA: Diagnosis not present

## 2020-12-09 DIAGNOSIS — Z0000 Encounter for general adult medical examination without abnormal findings: Secondary | ICD-10-CM | POA: Diagnosis not present

## 2020-12-09 DIAGNOSIS — K219 Gastro-esophageal reflux disease without esophagitis: Secondary | ICD-10-CM | POA: Diagnosis not present

## 2021-01-13 DIAGNOSIS — L821 Other seborrheic keratosis: Secondary | ICD-10-CM | POA: Diagnosis not present

## 2021-01-13 DIAGNOSIS — L570 Actinic keratosis: Secondary | ICD-10-CM | POA: Diagnosis not present

## 2021-02-03 DIAGNOSIS — Z1231 Encounter for screening mammogram for malignant neoplasm of breast: Secondary | ICD-10-CM | POA: Diagnosis not present

## 2021-03-21 DIAGNOSIS — L570 Actinic keratosis: Secondary | ICD-10-CM | POA: Diagnosis not present

## 2021-03-21 DIAGNOSIS — L233 Allergic contact dermatitis due to drugs in contact with skin: Secondary | ICD-10-CM | POA: Diagnosis not present

## 2021-05-16 DIAGNOSIS — L233 Allergic contact dermatitis due to drugs in contact with skin: Secondary | ICD-10-CM | POA: Diagnosis not present

## 2021-05-16 DIAGNOSIS — L718 Other rosacea: Secondary | ICD-10-CM | POA: Diagnosis not present

## 2021-05-16 DIAGNOSIS — L821 Other seborrheic keratosis: Secondary | ICD-10-CM | POA: Diagnosis not present

## 2021-05-16 DIAGNOSIS — L570 Actinic keratosis: Secondary | ICD-10-CM | POA: Diagnosis not present

## 2021-05-16 DIAGNOSIS — L814 Other melanin hyperpigmentation: Secondary | ICD-10-CM | POA: Diagnosis not present

## 2021-06-03 DIAGNOSIS — H6121 Impacted cerumen, right ear: Secondary | ICD-10-CM | POA: Diagnosis not present

## 2021-08-30 DIAGNOSIS — H903 Sensorineural hearing loss, bilateral: Secondary | ICD-10-CM | POA: Diagnosis not present

## 2021-10-05 DIAGNOSIS — Z961 Presence of intraocular lens: Secondary | ICD-10-CM | POA: Diagnosis not present

## 2021-10-05 DIAGNOSIS — H52223 Regular astigmatism, bilateral: Secondary | ICD-10-CM | POA: Diagnosis not present

## 2021-10-05 DIAGNOSIS — H04123 Dry eye syndrome of bilateral lacrimal glands: Secondary | ICD-10-CM | POA: Diagnosis not present

## 2021-10-05 DIAGNOSIS — Z135 Encounter for screening for eye and ear disorders: Secondary | ICD-10-CM | POA: Diagnosis not present

## 2021-10-21 DIAGNOSIS — Z23 Encounter for immunization: Secondary | ICD-10-CM | POA: Diagnosis not present

## 2021-12-12 DIAGNOSIS — K219 Gastro-esophageal reflux disease without esophagitis: Secondary | ICD-10-CM | POA: Diagnosis not present

## 2021-12-12 DIAGNOSIS — Z0000 Encounter for general adult medical examination without abnormal findings: Secondary | ICD-10-CM | POA: Diagnosis not present

## 2021-12-12 DIAGNOSIS — I129 Hypertensive chronic kidney disease with stage 1 through stage 4 chronic kidney disease, or unspecified chronic kidney disease: Secondary | ICD-10-CM | POA: Diagnosis not present

## 2021-12-12 DIAGNOSIS — Z7189 Other specified counseling: Secondary | ICD-10-CM | POA: Diagnosis not present

## 2021-12-12 DIAGNOSIS — E782 Mixed hyperlipidemia: Secondary | ICD-10-CM | POA: Diagnosis not present

## 2021-12-12 DIAGNOSIS — M19079 Primary osteoarthritis, unspecified ankle and foot: Secondary | ICD-10-CM | POA: Diagnosis not present

## 2021-12-12 DIAGNOSIS — Z1331 Encounter for screening for depression: Secondary | ICD-10-CM | POA: Diagnosis not present

## 2022-03-22 DIAGNOSIS — D171 Benign lipomatous neoplasm of skin and subcutaneous tissue of trunk: Secondary | ICD-10-CM | POA: Diagnosis not present

## 2022-03-22 DIAGNOSIS — L821 Other seborrheic keratosis: Secondary | ICD-10-CM | POA: Diagnosis not present

## 2022-03-22 DIAGNOSIS — Z08 Encounter for follow-up examination after completed treatment for malignant neoplasm: Secondary | ICD-10-CM | POA: Diagnosis not present

## 2022-03-22 DIAGNOSIS — D225 Melanocytic nevi of trunk: Secondary | ICD-10-CM | POA: Diagnosis not present

## 2022-03-22 DIAGNOSIS — T63421A Toxic effect of venom of ants, accidental (unintentional), initial encounter: Secondary | ICD-10-CM | POA: Diagnosis not present

## 2022-03-22 DIAGNOSIS — Z85828 Personal history of other malignant neoplasm of skin: Secondary | ICD-10-CM | POA: Diagnosis not present

## 2022-03-22 DIAGNOSIS — D1723 Benign lipomatous neoplasm of skin and subcutaneous tissue of right leg: Secondary | ICD-10-CM | POA: Diagnosis not present

## 2022-03-22 DIAGNOSIS — L570 Actinic keratosis: Secondary | ICD-10-CM | POA: Diagnosis not present

## 2022-03-22 DIAGNOSIS — L814 Other melanin hyperpigmentation: Secondary | ICD-10-CM | POA: Diagnosis not present

## 2022-03-22 DIAGNOSIS — D485 Neoplasm of uncertain behavior of skin: Secondary | ICD-10-CM | POA: Diagnosis not present

## 2022-04-03 ENCOUNTER — Ambulatory Visit
Admission: RE | Admit: 2022-04-03 | Discharge: 2022-04-03 | Disposition: A | Discharge status: Home / Self Care | Payer: Medicare Other | Source: Ambulatory Visit | Attending: Physician Assistant | Admitting: Physician Assistant

## 2022-04-03 ENCOUNTER — Other Ambulatory Visit: Payer: Self-pay | Admitting: Physician Assistant

## 2022-04-03 DIAGNOSIS — M25562 Pain in left knee: Secondary | ICD-10-CM

## 2022-04-03 DIAGNOSIS — M1712 Unilateral primary osteoarthritis, left knee: Secondary | ICD-10-CM | POA: Diagnosis not present

## 2022-06-29 DIAGNOSIS — Z09 Encounter for follow-up examination after completed treatment for conditions other than malignant neoplasm: Secondary | ICD-10-CM | POA: Diagnosis not present

## 2022-06-29 DIAGNOSIS — Z872 Personal history of diseases of the skin and subcutaneous tissue: Secondary | ICD-10-CM | POA: Diagnosis not present

## 2022-06-29 DIAGNOSIS — D485 Neoplasm of uncertain behavior of skin: Secondary | ICD-10-CM | POA: Diagnosis not present

## 2022-06-29 DIAGNOSIS — L989 Disorder of the skin and subcutaneous tissue, unspecified: Secondary | ICD-10-CM | POA: Diagnosis not present

## 2022-07-25 DIAGNOSIS — C44619 Basal cell carcinoma of skin of left upper limb, including shoulder: Secondary | ICD-10-CM | POA: Diagnosis not present

## 2022-07-26 DIAGNOSIS — C44619 Basal cell carcinoma of skin of left upper limb, including shoulder: Secondary | ICD-10-CM | POA: Diagnosis not present

## 2022-07-26 DIAGNOSIS — L821 Other seborrheic keratosis: Secondary | ICD-10-CM | POA: Diagnosis not present

## 2022-07-26 DIAGNOSIS — I788 Other diseases of capillaries: Secondary | ICD-10-CM | POA: Diagnosis not present

## 2022-07-28 DIAGNOSIS — L080 Pyoderma: Secondary | ICD-10-CM | POA: Diagnosis not present

## 2022-07-28 DIAGNOSIS — L239 Allergic contact dermatitis, unspecified cause: Secondary | ICD-10-CM | POA: Diagnosis not present

## 2022-10-25 DIAGNOSIS — Z08 Encounter for follow-up examination after completed treatment for malignant neoplasm: Secondary | ICD-10-CM | POA: Diagnosis not present

## 2022-10-25 DIAGNOSIS — Z85828 Personal history of other malignant neoplasm of skin: Secondary | ICD-10-CM | POA: Diagnosis not present

## 2022-10-25 DIAGNOSIS — L218 Other seborrheic dermatitis: Secondary | ICD-10-CM | POA: Diagnosis not present

## 2022-10-30 DIAGNOSIS — Z23 Encounter for immunization: Secondary | ICD-10-CM | POA: Diagnosis not present

## 2022-12-14 DIAGNOSIS — E782 Mixed hyperlipidemia: Secondary | ICD-10-CM | POA: Diagnosis not present

## 2022-12-14 DIAGNOSIS — I129 Hypertensive chronic kidney disease with stage 1 through stage 4 chronic kidney disease, or unspecified chronic kidney disease: Secondary | ICD-10-CM | POA: Diagnosis not present

## 2022-12-14 DIAGNOSIS — N1831 Chronic kidney disease, stage 3a: Secondary | ICD-10-CM | POA: Diagnosis not present

## 2022-12-14 DIAGNOSIS — Z1331 Encounter for screening for depression: Secondary | ICD-10-CM | POA: Diagnosis not present

## 2022-12-14 DIAGNOSIS — K279 Peptic ulcer, site unspecified, unspecified as acute or chronic, without hemorrhage or perforation: Secondary | ICD-10-CM | POA: Diagnosis not present

## 2022-12-14 DIAGNOSIS — Z23 Encounter for immunization: Secondary | ICD-10-CM | POA: Diagnosis not present

## 2022-12-14 DIAGNOSIS — K219 Gastro-esophageal reflux disease without esophagitis: Secondary | ICD-10-CM | POA: Diagnosis not present

## 2022-12-14 DIAGNOSIS — Z0000 Encounter for general adult medical examination without abnormal findings: Secondary | ICD-10-CM | POA: Diagnosis not present

## 2022-12-14 DIAGNOSIS — H3530 Unspecified macular degeneration: Secondary | ICD-10-CM | POA: Diagnosis not present

## 2022-12-14 DIAGNOSIS — R413 Other amnesia: Secondary | ICD-10-CM | POA: Diagnosis not present
